# Patient Record
Sex: Female | Born: 1965 | Hispanic: No | Marital: Married | Smoking: Never smoker
Health system: Southern US, Community
[De-identification: ages and names within clinical notes are randomized; demographics above are authoritative.]

## PROBLEM LIST (undated history)

## (undated) DIAGNOSIS — Z9889 Other specified postprocedural states: Secondary | ICD-10-CM

## (undated) DIAGNOSIS — R112 Nausea with vomiting, unspecified: Secondary | ICD-10-CM

## (undated) DIAGNOSIS — K589 Irritable bowel syndrome without diarrhea: Secondary | ICD-10-CM

## (undated) HISTORY — DX: Irritable bowel syndrome, unspecified: K58.9

## (undated) HISTORY — PX: APPENDECTOMY: SHX54

---

## 1998-10-27 HISTORY — PX: CHOLECYSTECTOMY: SHX55

## 2001-04-09 ENCOUNTER — Other Ambulatory Visit: Admission: RE | Admit: 2001-04-09 | Discharge: 2001-04-09 | Payer: Self-pay | Admitting: Obstetrics and Gynecology

## 2002-01-27 ENCOUNTER — Emergency Department (HOSPITAL_COMMUNITY): Admission: EM | Admit: 2002-01-27 | Discharge: 2002-01-27 | Payer: Self-pay | Admitting: *Deleted

## 2002-02-02 ENCOUNTER — Ambulatory Visit (HOSPITAL_COMMUNITY): Admission: RE | Admit: 2002-02-02 | Discharge: 2002-02-02 | Payer: Self-pay | Admitting: *Deleted

## 2002-02-21 ENCOUNTER — Other Ambulatory Visit: Admission: RE | Admit: 2002-02-21 | Discharge: 2002-02-21 | Payer: Self-pay | Admitting: Obstetrics and Gynecology

## 2002-10-27 HISTORY — PX: COLONOSCOPY: SHX174

## 2002-11-28 ENCOUNTER — Ambulatory Visit (HOSPITAL_COMMUNITY): Admission: RE | Admit: 2002-11-28 | Discharge: 2002-11-28 | Payer: Self-pay | Admitting: Internal Medicine

## 2002-11-28 ENCOUNTER — Encounter: Payer: Self-pay | Admitting: Internal Medicine

## 2003-01-30 ENCOUNTER — Ambulatory Visit (HOSPITAL_COMMUNITY): Admission: RE | Admit: 2003-01-30 | Discharge: 2003-01-30 | Payer: Self-pay | Admitting: Internal Medicine

## 2003-03-09 ENCOUNTER — Other Ambulatory Visit: Admission: RE | Admit: 2003-03-09 | Discharge: 2003-03-09 | Payer: Self-pay | Admitting: Obstetrics and Gynecology

## 2004-04-17 ENCOUNTER — Other Ambulatory Visit: Admission: RE | Admit: 2004-04-17 | Discharge: 2004-04-17 | Payer: Self-pay | Admitting: Obstetrics and Gynecology

## 2007-10-28 HISTORY — PX: COLONOSCOPY: SHX174

## 2009-10-18 ENCOUNTER — Ambulatory Visit (HOSPITAL_COMMUNITY): Admission: RE | Admit: 2009-10-18 | Discharge: 2009-10-18 | Payer: Self-pay | Admitting: Family Medicine

## 2010-04-24 ENCOUNTER — Encounter (INDEPENDENT_AMBULATORY_CARE_PROVIDER_SITE_OTHER): Payer: Self-pay | Admitting: General Surgery

## 2010-04-24 ENCOUNTER — Observation Stay (HOSPITAL_COMMUNITY): Admission: RE | Admit: 2010-04-24 | Discharge: 2010-04-25 | Payer: Self-pay | Admitting: Family Medicine

## 2011-01-12 LAB — URINE MICROSCOPIC-ADD ON

## 2011-01-12 LAB — CBC
HCT: 36.4 % (ref 36.0–46.0)
Hemoglobin: 12.7 g/dL (ref 12.0–15.0)
MCH: 31.7 pg (ref 26.0–34.0)
MCHC: 34.8 g/dL (ref 30.0–36.0)
MCV: 91.1 fL (ref 78.0–100.0)
Platelets: 189 10*3/uL (ref 150–400)
RBC: 4 MIL/uL (ref 3.87–5.11)
RDW: 12.7 % (ref 11.5–15.5)
WBC: 5.5 10*3/uL (ref 4.0–10.5)

## 2011-01-12 LAB — COMPREHENSIVE METABOLIC PANEL
Albumin: 3.8 g/dL (ref 3.5–5.2)
Alkaline Phosphatase: 52 U/L (ref 39–117)
BUN: 8 mg/dL (ref 6–23)
Chloride: 104 mEq/L (ref 96–112)
Creatinine, Ser: 0.55 mg/dL (ref 0.4–1.2)
GFR calc non Af Amer: >60 mL/min (ref >60)
Glucose, Bld: 83 mg/dL (ref 70–99)
Potassium: 3.1 mEq/L — ABNORMAL LOW (ref 3.5–5.1)
Total Bilirubin: 0.7 mg/dL (ref 0.3–1.2)

## 2011-01-12 LAB — URINALYSIS, ROUTINE W REFLEX MICROSCOPIC
Nitrite: NEGATIVE
Specific Gravity, Urine: >1.03 — ABNORMAL HIGH (ref 1.005–1.030)
Urobilinogen, UA: 0.2 mg/dL (ref 0.0–1.0)
pH: 5.5 (ref 5.0–8.0)

## 2011-01-12 LAB — DIFFERENTIAL
Basophils Absolute: 0 10*3/uL (ref 0.0–0.1)
Basophils Relative: 0 % (ref 0–1)
Lymphocytes Relative: 13 % (ref 12–46)
Monocytes Absolute: 0.3 10*3/uL (ref 0.1–1.0)
Neutro Abs: 4.5 10*3/uL (ref 1.7–7.7)
Neutrophils Relative %: 82 % — ABNORMAL HIGH (ref 43–77)

## 2011-01-12 LAB — URINE CULTURE

## 2011-01-12 LAB — POCT PREGNANCY, URINE: Preg Test, Ur: NEGATIVE

## 2011-03-14 NOTE — Op Note (Signed)
NAME:  Samantha [name], Samantha [name]                          ACCOUNT NO.:  [id number]   MEDICAL RECORD NO.:  [id number]                   PATIENT TYPE:  AMB   LOCATION:  DAY                                  FACILITY:  [facility]   PHYSICIAN:  R. Roetta Sessions, M.D.              DATE OF BIRTH:  08-23-1966   DATE OF PROCEDURE:  01/30/2003  DATE OF DISCHARGE:                                 OPERATIVE REPORT   PROCEDURE:  Diagnostic colonoscopy.   INDICATIONS FOR PROCEDURE:  The patient is a 45 year old lady with a 20+-  year history of intermittent diarrhea, intermittent low-volume hematochezia.  Celiac antibody profile was recently normal.  She has been diagnosed with  irritable bowel syndrome previously.  Colonoscopy is now being done to  further evaluate diarrhea and hematochezia.  This approach has been  discussed with the patient at length.  The potential risks, benefits, and  alternatives have been reviewed and questions answered.  Please see my  consultation noted of 01/23/2003 for more information.   PROCEDURE:  O2 saturation, blood pressure, pulses, and respirations were  monitored throughout the entire procedure.  Conscious sedation was with  Versed 4 mg IV, Demerol 50 mg IV in divided doses.  The instrument used was  the Olympus video chip colonoscope.   FINDINGS:  Digital rectal examination revealed no abnormalities.   ENDOSCOPIC FINDINGS:  The prep was adequate.   Rectum:  Examination of the rectal mucosa including retroflex view of the  anal verge and __________  view of the anal canal demonstrated internal  hemorrhoids only.   Colon:  The colonic mucosa was surveyed from the rectosigmoid junction  through the left, transverse, right colon to the area of the appendiceal  orifice, ileocecal valve, and cecum.  These structures were well-seen and  photographed for the record. The colonic mucosa all the way to the cecum  appeared normal.  The terminal ileum was intubated to 10 cm.  This  segment  of GI tract also appeared normal.  From this level, the scope was slowly and  cautiously withdrawn.  All previously mentioned mucosal surfaces were again  seen.  No other abnormalities were observed.  The patient tolerated the  procedure well and was reactive in endoscopy.   IMPRESSION:  Internal hemorrhoids, otherwise normal rectum, colon, and  terminal ileum.  The patient most likely experienced rectal bleeding from  hemorrhoids from time to time.   RECOMMENDATIONS:  1. Given the chronicity of her diarrhea and extensive negative workup,     including today's findings, I feel that the above reaffirms the diagnosis     of irritable bowel syndrome.  Recommendations were discussed about the     benign but chronic nature of irritable bowel syndrome with the patient.     Hemorrhoid literature provided to the patient.     10-day course of Anusol HC suppositories one per rectum at bedtime.  2.  NuLev tablets one on the tongue before meals and at bedtime as needed for     abdominal cramps and diarrhea.  3. Follow up with me in one month.                                               Jonathon Bellows, M.D.    RMR/MEDQ  D:  01/30/2003  T:  01/30/2003  Job:  [id number]   cc:   Corrie Mckusick, M.D.  [address] [place]    [place] [address]  Fax: [phone]

## 2011-03-14 NOTE — H&P (Signed)
[name], [name]                          ACCOUNT NO.:  [id number]   MEDICAL RECORD NO.:  [id number]                   PATIENT TYPE:   LOCATION:                                       FACILITY:   PHYSICIAN:  R. Roetta Sessions, M.D.              DATE OF BIRTH:  1966/10/10   DATE OF ADMISSION:  DATE OF DISCHARGE:                                HISTORY & PHYSICAL   CHIEF COMPLAINT:  Diarrhea and blood per rectum, in need of colonoscopy.   HISTORY OF PRESENT ILLNESS:  The patient is a 45 year old lady with at least  a 20-year history of diarrhea and intermittent small volume blood per  rectum.  She has failed to respond to a course of Bentyl and Pepto Bismol  recently.  Stool studies have been negative.  Small bowel follow through,  aside from revealing a single descending duodenal diverticulum, was  negative.  She is very much concerned about the possibility of having  underlying carcinoma.  She has been seen by multiple physicians previously.  Once, colonoscopy was reportedly negative back in the mid 1990s.  Metabolic  profile including TSH recently negative.   PAST MEDICAL HISTORY:  Given diagnosis of irritable bowel syndrome  previously.   PAST SURGICAL HISTORY:  Cholecystectomy for cholelithiasis in [place] in [place].   CURRENT MEDICATIONS:  Tylenol p.r.n.   ALLERGIES:  CLARITIN and BENTYL (caused worsening of abdominal pain and  diarrhea per the patient's report).   FAMILY HISTORY:  No family history of IBD or colorectal cancer.  Both  parents have symptoms of TIAs.   SOCIAL HISTORY:  The patient has been married for 18 years, has one child,  is employed as a Scientist, physiological at [employer].   REVIEW OF SYSTEMS:  No chest pain, dyspnea, fever, chills.  A couple of  years ago did weight 100 pounds but because oral intake has picked up she is  up to 124.   PHYSICAL EXAMINATION TODAY:  GENERAL:  Alert, well-groomed, comfortable,  conversant, no  acute distress.  VITAL SIGNS:  Weight 128, blood pressure 100/60, pulse 68.  SKIN:  Warm and dry.  CHEST:  Lungs are clear to auscultation.  CARDIAC:  Regular rate and rhythm without murmur, gallop, or rub.  ABDOMEN:  Nondistended, positive bowel sounds, soft, nontender, without  appreciable mass or organomegaly.  EXTREMITIES:  No edema.  RECTAL:  Deferred to time of colonoscopy.   IMPRESSION:  The patient is a 45 year old lady with at least a two-decade  history of diarrhea, small volume blood per rectum occasionally.  She tells  me intermittently she has normal bowel function (i.e. one stool daily),  never more than five or six loose stools daily.   Symptoms really sound most consistent with irritable bowel syndrome.  The  patient has a fear of an underlying malignancy causing her symptoms.  We  have talked about the  benign but chronic nature of irritable bowel syndrome;  she does have low-volume blood per rectum.   RECOMMENDATIONS:  We will go ahead and offer the patient a colonoscopy to  further evaluate her chronic diarrhea and blood per rectum.  In addition,  will go ahead and draw a celiac and antibody to near completion of the  workup of her diarrhea.  Her questions were answered and she is agreeable to  the plan to perform colonoscopy in the near future.                                               Jonathon Bellows, M.D.    RMR/MEDQ  D:  01/23/2003  T:  01/23/2003  Job:  [id number]   cc:   Corrie Mckusick, M.D.  [address] [place]  [place]  [place] [address]  Fax: [phone]

## 2011-09-08 ENCOUNTER — Other Ambulatory Visit: Payer: Self-pay | Admitting: Obstetrics and Gynecology

## 2012-09-16 ENCOUNTER — Other Ambulatory Visit: Payer: Self-pay | Admitting: Obstetrics and Gynecology

## 2013-09-28 ENCOUNTER — Other Ambulatory Visit: Payer: Self-pay | Admitting: Obstetrics and Gynecology

## 2014-03-21 ENCOUNTER — Other Ambulatory Visit: Payer: Self-pay | Admitting: Urology

## 2014-03-21 ENCOUNTER — Ambulatory Visit (INDEPENDENT_AMBULATORY_CARE_PROVIDER_SITE_OTHER): Payer: 59 | Admitting: Urology

## 2014-03-21 DIAGNOSIS — R3129 Other microscopic hematuria: Secondary | ICD-10-CM

## 2014-03-21 DIAGNOSIS — N368 Other specified disorders of urethra: Secondary | ICD-10-CM

## 2014-03-21 DIAGNOSIS — N393 Stress incontinence (female) (male): Secondary | ICD-10-CM

## 2014-03-21 DIAGNOSIS — N3000 Acute cystitis without hematuria: Secondary | ICD-10-CM

## 2014-03-23 ENCOUNTER — Ambulatory Visit (HOSPITAL_COMMUNITY)
Admission: RE | Admit: 2014-03-23 | Discharge: 2014-03-23 | Disposition: A | Discharge status: Home / Self Care | Payer: 59 | Source: Ambulatory Visit | Attending: Urology | Admitting: Urology

## 2014-03-23 DIAGNOSIS — R3129 Other microscopic hematuria: Secondary | ICD-10-CM | POA: Insufficient documentation

## 2014-03-23 DIAGNOSIS — N200 Calculus of kidney: Secondary | ICD-10-CM | POA: Insufficient documentation

## 2014-03-23 DIAGNOSIS — Q619 Cystic kidney disease, unspecified: Secondary | ICD-10-CM | POA: Insufficient documentation

## 2014-03-23 MED ORDER — IOHEXOL 300 MG/ML  SOLN
125.0000 mL | Freq: Once | INTRAMUSCULAR | Status: AC | PRN
  Administered 2014-03-23: 125 mL via INTRAVENOUS

## 2014-04-18 ENCOUNTER — Ambulatory Visit (INDEPENDENT_AMBULATORY_CARE_PROVIDER_SITE_OTHER): Payer: 59 | Admitting: Urology

## 2014-04-18 DIAGNOSIS — N3000 Acute cystitis without hematuria: Secondary | ICD-10-CM

## 2014-04-18 DIAGNOSIS — N281 Cyst of kidney, acquired: Secondary | ICD-10-CM

## 2014-04-18 DIAGNOSIS — R3129 Other microscopic hematuria: Secondary | ICD-10-CM

## 2014-10-17 ENCOUNTER — Ambulatory Visit (INDEPENDENT_AMBULATORY_CARE_PROVIDER_SITE_OTHER): Payer: 59 | Admitting: Urology

## 2014-10-17 DIAGNOSIS — N281 Cyst of kidney, acquired: Secondary | ICD-10-CM

## 2015-04-12 ENCOUNTER — Other Ambulatory Visit: Payer: Self-pay | Admitting: Obstetrics and Gynecology

## 2015-04-13 LAB — CYTOLOGY - PAP

## 2015-08-24 ENCOUNTER — Telehealth: Payer: Self-pay | Admitting: Internal Medicine

## 2015-08-24 ENCOUNTER — Encounter: Payer: Self-pay | Admitting: Internal Medicine

## 2015-09-13 ENCOUNTER — Ambulatory Visit (INDEPENDENT_AMBULATORY_CARE_PROVIDER_SITE_OTHER): Payer: 59 | Admitting: Gastroenterology

## 2015-09-13 ENCOUNTER — Encounter: Payer: Self-pay | Admitting: Gastroenterology

## 2015-09-13 ENCOUNTER — Other Ambulatory Visit: Payer: Self-pay

## 2015-09-13 VITALS — BP 107/71 | HR 71 | Temp 97.3°F | Ht 65.0 in | Wt 145.4 lb

## 2015-09-13 DIAGNOSIS — R1084 Generalized abdominal pain: Secondary | ICD-10-CM | POA: Diagnosis not present

## 2015-09-13 DIAGNOSIS — Z8371 Family history of colonic polyps: Secondary | ICD-10-CM | POA: Insufficient documentation

## 2015-09-13 DIAGNOSIS — R109 Unspecified abdominal pain: Secondary | ICD-10-CM

## 2015-09-13 DIAGNOSIS — K625 Hemorrhage of anus and rectum: Secondary | ICD-10-CM

## 2015-09-13 DIAGNOSIS — K529 Noninfective gastroenteritis and colitis, unspecified: Secondary | ICD-10-CM | POA: Diagnosis not present

## 2015-09-13 DIAGNOSIS — Z83719 Family history of colon polyps, unspecified: Secondary | ICD-10-CM

## 2015-09-13 DIAGNOSIS — R197 Diarrhea, unspecified: Secondary | ICD-10-CM

## 2015-09-13 MED ORDER — DICYCLOMINE HCL 10 MG PO CAPS: 10.0000 mg | ORAL_CAPSULE | Freq: Three times a day (TID) | ORAL | Status: DC

## 2015-09-13 MED ORDER — PEG 3350-KCL-NA BICARB-NACL 420 G PO SOLR: 4000.0000 mL | Freq: Once | ORAL | Status: DC

## 2015-09-13 NOTE — Patient Instructions (Signed)
Please collect stools and return to lab. Please have your lab work done. Trial of Bentyl once before each meal and at bedtime for cramps and diarrhea. No more than 4 daily. Hold for constipation.  Stop both Bentyl and Imodium 2 days prior to your bowel prep.

## 2015-09-13 NOTE — Assessment & Plan Note (Addendum)
49 year old female with chronic diarrhea for more than 3 decades presents with worsening symptoms, some rectal bleeding. Denies alarm symptoms such as nocturnal diarrhea, weight loss. Rarely has a solid stool. Recent antibiotic use, need to exclude infectious etiology such as C. difficile. Suspect baseline IBS-D, less likely IBD. She has a family history of colon polyps at a fairly young age, last colonoscopy 6 years ago.  - Plan for colonoscopy with ileoscopy plus or minus random colon biopsies in the near future. I have discussed the risks, alternatives, benefits with regards to but not limited to the risk of reaction to medication, bleeding, infection, perforation and the patient is agreeable to proceed. Written consent to be obtained. -We will check celiac serologies, TSH, stools for infectious etiology.  -Trial of Bentyl 10 mg every before meals daily at bedtime. Hold imodium and bentyl two days before bowel prep for TCS.

## 2015-09-13 NOTE — Progress Notes (Signed)
Primary Care Physician:  Colette RibasGOLDING, JOHN CABOT, MD  Primary Gastroenterologist:  Roetta SessionsMichael Rourk, MD   Chief Complaint  Patient presents with  . Diarrhea  . Blood In Stools    HPI:  Samantha [name] is a 49 y.o. female here for consideration of colonoscopy. She has chronic diarrhea dating back to teenage years. Diagnosed with IBS previously. Seen previously by Dr. Jena Gaussourk. Colonoscopy in 2004 with ileoscopy unremarkable except for internal hemorrhoids.  In 2009 she had a colonoscopy with [facility] Sherin [facility]Weissman at [facility] which was unremarkable.   Of the year she reports that her diarrhea has worsened. Has been worse in the last couple months. Antibiotics for sinusitis and other infection in that time frame. Bowel movements parotic. Denies any solid stools. Complains of postprandial fecal urgency. No nocturnal diarrhea. Will have a burst of 5 stools daily some days. During the week she utilizes Imodium sometimes up to 3-4 times daily to control her symptoms. Continues to have to take off of work related to the diarrhea. Rare solid stool. Primary blood per rectum. Unsure if it is related to her hemorrhoids or other. No melena. No heartburn, vomiting, weight loss. Symptoms associated with abdominal cramping. Sometimes better with a BM but not always. Previously tried Levsin which helped some. Never tried Bentyl or Viberzi. Continues to have regular menstrual cycles. Diarrhea worsened with cycles. States she has a history of endometriosis (diagnosed at time of her appendectomy in 2011).   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 Bowels unremarkable. Small diverticulum off the descending duodenum.    Current Outpatient Prescriptions  Medication Sig Dispense Refill  . loperamide (IMODIUM) 2 MG capsule Take 2 mg by mouth daily.    Marland Kitchen. dicyclomine (BENTYL) 10 MG  capsule Take 1 capsule (10 mg total) by mouth 4 (four) times daily -  before meals and at bedtime. 120 capsule 3  . polyethylene glycol-electrolytes (NULYTELY/GOLYTELY) 420 G solution Take 4,000 mLs by mouth once. 4000 mL 0   No current facility-administered medications for this visit.    Allergies as of 09/13/2015  . (No Known Allergies)    Past Medical History  Diagnosis Date  . IBS (irritable bowel syndrome)     Past Surgical History  Procedure Laterality Date  . Appendectomy    . Colonoscopy  2004    Dr. Jena Gaussourk: normal ileoscolonoscopy, internal hemorrhodis  . Colonoscopy  2009    [facility]:normal.  . [place]  2000    [place]    Family History  Problem Relation Age of Onset  . Colon polyps Father     [place], around age 51    Social History   Social History  . Marital Status: Married    Spouse Name: N/A  . Number of Children: 2  . Years of Education: N/A   Occupational History  . Not on file.   Social History Main Topics  . Smoking status: Never Smoker   . Smokeless tobacco: Not on file  . Alcohol Use: No  . Drug Use: No  . Sexual Activity: Not on file   Other Topics Concern  . Not on file   Social History Narrative  . No narrative on file      ROS:  General: Negative for anorexia, weight loss, fever, chills, fatigue, weakness. Eyes: Negative for vision changes.  ENT:  Negative for hoarseness, difficulty swallowing , nasal congestion. CV: Negative for chest pain, angina, palpitations, dyspnea on exertion, peripheral edema.  Respiratory: Negative for dyspnea at rest, dyspnea on exertion, cough, sputum, wheezing.  GI: See history of present illness. GU:  Negative for dysuria, hematuria, urinary incontinence, urinary frequency, nocturnal urination.  MS: Negative for joint pain, low back pain.  Derm: Negative for rash or itching.  Neuro: Negative for weakness, abnormal sensation, seizure, frequent headaches, memory loss, confusion.   Psych: Negative for anxiety, depression, suicidal ideation, hallucinations.  Endo: Negative for unusual weight change.  Heme: Negative for bruising or bleeding. Allergy: Negative for rash or hives.    Physical Examination:  BP 107/71 mmHg  Pulse 71  Temp(Src) 97.3 F (36.3 C) (Oral)  Ht 5' 5" (1.651 m)  Wt 145 lb 6.4 oz (65.953 kg)  BMI 24.20 kg/m2  LMP 09/13/2015   General: Well-nourished, well-developed in no acute distress.  Head: Normocephalic, atraumatic.   Eyes: Conjunctiva pink, no icterus. Mouth: Oropharyngeal mucosa moist and pink , no lesions erythema or exudate. Neck: Supple without thyromegaly, masses, or lymphadenopathy.  Lungs: Clear to auscultation bilaterally.  Heart: Regular rate and rhythm, no murmurs rubs or gallops.  Abdomen: Bowel sounds are normal, nontender, nondistended, no hepatosplenomegaly or masses, no abdominal bruits or    hernia , no rebound or guarding.   Rectal: deferred Extremities: No lower extremity edema. No clubbing or deformities.  Neuro: Alert and oriented x 4 , grossly normal neurologically.  Skin: Warm and dry, no rash or jaundice.   Psych: Alert and cooperative, normal mood and affect.  Labs: Labs from August 2016 What blood cell count 7600, hemoglobin 13.5, MCV 89, platelets 300,000, total bilirubin 0.6, alkaline phosphatase 68, AST 26, ALT 33, albumin 4.6, hemoglobin A1c 5.5  Imaging Studies: No results found.    

## 2015-09-14 LAB — TISSUE TRANSGLUTAMINASE, IGA: TISSUE TRANSGLUTAMINASE AB, IGA: 1 U/mL (ref <4)

## 2015-09-14 LAB — TSH: TSH: 1.009 u[IU]/mL (ref 0.350–4.500)

## 2015-09-16 NOTE — Progress Notes (Signed)
Quick Note:  No celiac. tsh normal. Await stools. tcs as planned. ______

## 2015-09-17 NOTE — Progress Notes (Signed)
CC'D TO PCP °

## 2015-09-27 ENCOUNTER — Encounter (HOSPITAL_COMMUNITY)
Admission: RE | Disposition: A | Discharge status: Home / Self Care | Payer: Self-pay | Source: Ambulatory Visit | Attending: Internal Medicine

## 2015-09-27 ENCOUNTER — Ambulatory Visit (HOSPITAL_COMMUNITY)
Admission: RE | Admit: 2015-09-27 | Discharge: 2015-09-27 | Disposition: A | Discharge status: Home / Self Care | Payer: 59 | Source: Ambulatory Visit | Attending: Internal Medicine | Admitting: Internal Medicine

## 2015-09-27 ENCOUNTER — Encounter (HOSPITAL_COMMUNITY): Payer: Self-pay | Admitting: *Deleted

## 2015-09-27 DIAGNOSIS — K921 Melena: Secondary | ICD-10-CM | POA: Insufficient documentation

## 2015-09-27 DIAGNOSIS — K648 Other hemorrhoids: Secondary | ICD-10-CM | POA: Diagnosis not present

## 2015-09-27 DIAGNOSIS — K625 Hemorrhage of anus and rectum: Secondary | ICD-10-CM

## 2015-09-27 DIAGNOSIS — Z8371 Family history of colonic polyps: Secondary | ICD-10-CM | POA: Diagnosis not present

## 2015-09-27 DIAGNOSIS — Z79899 Other long term (current) drug therapy: Secondary | ICD-10-CM | POA: Diagnosis not present

## 2015-09-27 DIAGNOSIS — R197 Diarrhea, unspecified: Secondary | ICD-10-CM | POA: Insufficient documentation

## 2015-09-27 DIAGNOSIS — R109 Unspecified abdominal pain: Secondary | ICD-10-CM | POA: Diagnosis not present

## 2015-09-27 HISTORY — PX: COLONOSCOPY: SHX5424

## 2015-09-27 SURGERY — COLONOSCOPY
Anesthesia: Moderate Sedation

## 2015-09-27 MED ORDER — SODIUM CHLORIDE 0.9 % IV SOLN
INTRAVENOUS | Status: DC
  Administered 2015-09-27: 1000 mL via INTRAVENOUS

## 2015-09-27 MED ORDER — STERILE WATER FOR IRRIGATION IR SOLN
Status: DC | PRN
  Administered 2015-09-27: 11:00:00

## 2015-09-27 MED ORDER — ONDANSETRON HCL 4 MG/2ML IJ SOLN
INTRAMUSCULAR | Status: AC
  Filled 2015-09-27: qty 2

## 2015-09-27 MED ORDER — MIDAZOLAM HCL 5 MG/5ML IJ SOLN
INTRAMUSCULAR | Status: DC | PRN
  Administered 2015-09-27 (×2): 1 mg via INTRAVENOUS
  Administered 2015-09-27: 2 mg via INTRAVENOUS

## 2015-09-27 MED ORDER — ONDANSETRON HCL 4 MG/2ML IJ SOLN
INTRAMUSCULAR | Status: DC | PRN
  Administered 2015-09-27: 4 mg via INTRAVENOUS

## 2015-09-27 MED ORDER — MEPERIDINE HCL 100 MG/ML IJ SOLN
INTRAMUSCULAR | Status: DC | PRN
  Administered 2015-09-27: 50 mg via INTRAVENOUS

## 2015-09-27 MED ORDER — MIDAZOLAM HCL 5 MG/5ML IJ SOLN
INTRAMUSCULAR | Status: AC
  Filled 2015-09-27: qty 10

## 2015-09-27 MED ORDER — MEPERIDINE HCL 100 MG/ML IJ SOLN
INTRAMUSCULAR | Status: AC
  Filled 2015-09-27: qty 2

## 2015-09-27 NOTE — H&P (View-Only) (Signed)
Primary Care Physician:  Colette RibasGOLDING, JOHN CABOT, MD  Primary Gastroenterologist:  Roetta SessionsMichael Rourk, MD   Chief Complaint  Patient presents with  . Diarrhea  . Blood In Stools    HPI:  Samantha [name] is a 49 y.o. female here for consideration of colonoscopy. She has chronic diarrhea dating back to teenage years. Diagnosed with IBS previously. Seen previously by Dr. Jena Gaussourk. Colonoscopy in 2004 with ileoscopy unremarkable except for internal hemorrhoids.  In 2009 she had a colonoscopy with Dr. Sherin QuarryWeissman at [facility] which was unremarkable.   Of the year she reports that her diarrhea has worsened. Has been worse in the last couple months. Antibiotics for sinusitis and other infection in that time frame. Bowel movements parotic. Denies any solid stools. Complains of postprandial fecal urgency. No nocturnal diarrhea. Will have a burst of 5 stools daily some days. During the week she utilizes Imodium sometimes up to 3-4 times daily to control her symptoms. Continues to have to take off of work related to the diarrhea. Rare solid stool. Primary blood per rectum. Unsure if it is related to her hemorrhoids or other. No melena. No heartburn, vomiting, weight loss. Symptoms associated with abdominal cramping. Sometimes better with a BM but not always. Previously tried Levsin which helped some. Never tried Bentyl or Viberzi. Continues to have regular menstrual cycles. Diarrhea worsened with cycles. States she has a history of endometriosis (diagnosed at time of her appendectomy in 2011).   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 Bowels unremarkable. Small diverticulum off the descending duodenum.    Current Outpatient Prescriptions  Medication Sig Dispense Refill  . loperamide (IMODIUM) 2 MG capsule Take 2 mg by mouth daily.    Marland Kitchen. dicyclomine (BENTYL) 10 MG  capsule Take 1 capsule (10 mg total) by mouth 4 (four) times daily -  before meals and at bedtime. 120 capsule 3  . polyethylene glycol-electrolytes (NULYTELY/GOLYTELY) 420 G solution Take 4,000 mLs by mouth once. 4000 mL 0   No current facility-administered medications for this visit.    Allergies as of 09/13/2015  . (No Known Allergies)    Past Medical History  Diagnosis Date  . IBS (irritable bowel syndrome)     Past Surgical History  Procedure Laterality Date  . Appendectomy    . Colonoscopy  2004    Dr. Jena Gaussourk: normal ileoscolonoscopy, internal hemorrhodis  . Colonoscopy  2009    [facility]:GN[facility]OZGI:normal.  . [place]ectomy  2000    [place]    Family History  Problem Relation Age of Onset  . Colon polyps Father     [place], around age 51    Social History   Social History  . Marital Status: Married    Spouse Name: N/A  . Number of Children: 2  . Years of Education: N/A   Occupational History  . Not on file.   Social History Main Topics  . Smoking status: Never Smoker   . Smokeless tobacco: Not on file  . Alcohol Use: No  . Drug Use: No  . Sexual Activity: Not on file   Other Topics Concern  . Not on file   Social History Narrative  . No narrative on file      ROS:  General: Negative for anorexia, weight loss, fever, chills, fatigue, weakness. Eyes: Negative for vision changes.  ENT:  Negative for hoarseness, difficulty swallowing , nasal congestion. CV: Negative for chest pain, angina, palpitations, dyspnea on exertion, peripheral edema.  Respiratory: Negative for dyspnea at rest, dyspnea on exertion, cough, sputum, wheezing.  GI: See history of present illness. GU:  Negative for dysuria, hematuria, urinary incontinence, urinary frequency, nocturnal urination.  MS: Negative for joint pain, low back pain.  Derm: Negative for rash or itching.  Neuro: Negative for weakness, abnormal sensation, seizure, frequent headaches, memory loss, confusion.   Psych: Negative for anxiety, depression, suicidal ideation, hallucinations.  Endo: Negative for unusual weight change.  Heme: Negative for bruising or bleeding. Allergy: Negative for rash or hives.    Physical Examination:  BP 107/71 mmHg  Pulse 71  Temp(Src) 97.3 F (36.3 C) (Oral)  Ht 5\' 5"  (1.651 m)  Wt 145 lb 6.4 oz (65.953 kg)  BMI 24.20 kg/m2  LMP 09/13/2015   General: Well-nourished, well-developed in no acute distress.  Head: Normocephalic, atraumatic.   Eyes: Conjunctiva pink, no icterus. Mouth: Oropharyngeal mucosa moist and pink , no lesions erythema or exudate. Neck: Supple without thyromegaly, masses, or lymphadenopathy.  Lungs: Clear to auscultation bilaterally.  Heart: Regular rate and rhythm, no murmurs rubs or gallops.  Abdomen: Bowel sounds are normal, nontender, nondistended, no hepatosplenomegaly or masses, no abdominal bruits or    hernia , no rebound or guarding.   Rectal: deferred Extremities: No lower extremity edema. No clubbing or deformities.  Neuro: Alert and oriented x 4 , grossly normal neurologically.  Skin: Warm and dry, no rash or jaundice.   Psych: Alert and cooperative, normal mood and affect.  Labs: Labs from August 2016 What blood cell count 7600, hemoglobin 13.5, MCV 89, platelets 300,000, total bilirubin 0.6, alkaline phosphatase 68, AST 26, ALT 33, albumin 4.6, hemoglobin A1c 5.5  Imaging Studies: No results found.

## 2015-09-27 NOTE — Op Note (Signed)
[facility] [address] [place], [id number]   COLONOSCOPY PROCEDURE REPORT  PATIENT: Samantha [name], Kristin E  MR#: [id number] BIRTHDATE: 10-May-1966 , 49  yrs. old GENDER: female ENDOSCOPIST: R.  Roetta SessionsMichael Leasha Goldberger, MD FACP [facility] REFERRED LK:GMWNUUBY:Kellie Goldsborough, M.D. PROCEDURE DATE:  09/27/2015 PROCEDURE:    Ileo-Colonoscopy, diagnostic and with biopsy INDICATIONS:Chronic diarrhea. MEDICATIONS: Versed 4 mg IV and Demerol 50 mg IV in divided doses. Zofran 4 mg IV. ASA CLASS:       Class II  CONSENT: The risks, benefits, alternatives and imponderables including but not limited to bleeding, perforation as well as the possibility of a missed lesion have been reviewed.  The potential for biopsy, lesion removal, etc. have also been discussed. Questions have been answered.  All parties agreeable.  Please see the history and physical in the medical record for more information.  DESCRIPTION OF PROCEDURE:   After the risks benefits and alternatives of the procedure were thoroughly explained, informed consent was obtained.  The digital rectal exam revealed no abnormalities of the rectum.   The EC-[id number] ([id number]  endoscope was introduced through the anus and advanced to the terminal ileum which was intubated for a short distance. No adverse events experienced.   The quality of the prep was adequate  The instrument was then slowly withdrawn as the colon was fully examined. Estimated blood loss is zero unless otherwise noted in this procedure report.      COLON FINDINGS: Normal-appearing rectal mucosa.  Rectal vault small; unable to retroflex.  However, mucosal surfaces seen well on?"face. Minimal internal hemorrhoids.  Normal-appearing colonic mucosa. The distal 10 cm of terminal ileal mucosa also appeared normal.  A segmental biopsies of the ascending/descending/sigmoid segments taken to evaluate for microscopic colitis.  Also, a stool sample was submitted for GI pathogen  panel.  Retroflexion was not performed. .  Withdrawal time=7 minutes 0 seconds.  The scope was withdrawn and the procedure completed. COMPLICATIONS: There were no immediate complications. EBL 2 mL ENDOSCOPIC IMPRESSION: Normal Ileo- colonoscopy  -  status post biopsy and stool sampling  RECOMMENDATIONS: Follow-up on pending studies. Further recommendations to follow.  eSigned:  R. Roetta SessionsMichael Melvenia Favela, MD Jerrel IvoryFACP [facility] 09/27/2015 11:03 AM   cc:  CPT CODES: ICD CODES:  The ICD and CPT codes recommended by this software are interpretations from the data that the clinical staff has captured with the software.  The verification of the translation of this report to the ICD and CPT codes and modifiers is the sole responsibility of the health care institution and practicing physician where this report was generated.  [employer] will not be held responsible for the validity of the ICD and CPT codes included on this report.  AMA assumes no liability for data contained or not contained herein. CPT is a Publishing rights managerregistered trademark of the [employer].  PATIENT NAME:  Samantha [name], Nur E MR#: [id number]

## 2015-09-27 NOTE — Interval H&P Note (Signed)
History and Physical Interval Note:  09/27/2015 10:32 AM  Samantha [name]  has presented today for surgery, with the diagnosis of rectal bleeding, diarrhea, abdominal pain, family history of colon polyps  The various methods of treatment have been discussed with the patient and family. After consideration of risks, benefits and other options for treatment, the patient has consented to  Procedure(s) with comments: COLONOSCOPY (N/A) - 1015 - moved to 10:30 - office to notify as a surgical intervention .  The patient's history has been reviewed, patient examined, no change in status, stable for surgery.  I have reviewed the patient's chart and labs.  Questions were answered to the patient's satisfaction.     Samantha [name]  No change. Colonoscopy per plan.  The risks, benefits, limitations, alternatives and imponderables have been reviewed with the patient. Questions have been answered. All parties are agreeable.

## 2015-09-27 NOTE — Discharge Instructions (Signed)
  Colonoscopy Discharge Instructions  Read the instructions outlined below and refer to this sheet in the next few weeks. These discharge instructions provide you with general information on caring for yourself after you leave the hospital. Your doctor may also give you specific instructions. While your treatment has been planned according to the most current medical practices available, unavoidable complications occasionally occur. If you have any problems or questions after discharge, call Dr. Jahmel Flannagan at [phone]. ACTIVITY  You may resume your regular activity, but move at a slower pace for the next 24 hours.   Take frequent rest periods for the next 24 hours.   Walking will help get rid of the air and reduce the bloated feeling in your belly (abdomen).   No driving for 24 hours (because of the medicine (anesthesia) used during the test).    Do not sign any important legal documents or operate any machinery for 24 hours (because of the anesthesia used during the test).  NUTRITION  Drink plenty of fluids.   You may resume your normal diet as instructed by your doctor.   Begin with a light meal and progress to your normal diet. Heavy or fried foods are harder to digest and may make you feel sick to your stomach (nauseated).   Avoid alcoholic beverages for 24 hours or as instructed.  MEDICATIONS  You may resume your normal medications unless your doctor tells you otherwise.  WHAT YOU CAN EXPECT TODAY  Some feelings of bloating in the abdomen.   Passage of more gas than usual.   Spotting of blood in your stool or on the toilet paper.  IF YOU HAD POLYPS REMOVED DURING THE COLONOSCOPY:  No aspirin products for 7 days or as instructed.   No alcohol for 7 days or as instructed.   Eat a soft diet for the next 24 hours.  FINDING OUT THE RESULTS OF YOUR TEST Not all test results are available during your visit. If your test results are not back during the visit, make an appointment  with your caregiver to find out the results. Do not assume everything is normal if you have not heard from your caregiver or the medical facility. It is important for you to follow up on all of your test results.  SEEK IMMEDIATE MEDICAL ATTENTION IF:  You have more than a spotting of blood in your stool.   Your belly is swollen (abdominal distention).   You are nauseated or vomiting.   You have a temperature over 101.   You have abdominal pain or discomfort that is severe or gets worse throughout the day.   Further recommendations to follow pending review of pathology report 

## 2015-09-29 LAB — GI PATHOGEN PANEL BY PCR, STOOL
C DIFFICILE TOXIN A/B: NOT DETECTED
CAMPYLOBACTER BY PCR: NOT DETECTED
Cryptosporidium by PCR: NOT DETECTED
E coli (ETEC) LT/ST: NOT DETECTED
E coli (STEC): NOT DETECTED
E coli 0157 by PCR: NOT DETECTED
G LAMBLIA BY PCR: NOT DETECTED
Norovirus GI/GII: NOT DETECTED
Rotavirus A by PCR: NOT DETECTED
Salmonella by PCR: NOT DETECTED
Shigella by PCR: NOT DETECTED

## 2015-09-30 ENCOUNTER — Encounter: Payer: Self-pay | Admitting: Internal Medicine

## 2015-10-01 ENCOUNTER — Encounter: Payer: Self-pay | Admitting: Internal Medicine

## 2015-10-01 ENCOUNTER — Encounter (HOSPITAL_COMMUNITY): Payer: Self-pay | Admitting: Internal Medicine

## 2015-10-01 ENCOUNTER — Other Ambulatory Visit: Payer: Self-pay | Admitting: Urology

## 2015-10-01 ENCOUNTER — Other Ambulatory Visit: Payer: Self-pay

## 2015-10-01 ENCOUNTER — Telehealth: Payer: Self-pay

## 2015-10-01 DIAGNOSIS — K529 Noninfective gastroenteritis and colitis, unspecified: Secondary | ICD-10-CM

## 2015-10-01 DIAGNOSIS — N281 Cyst of kidney, acquired: Secondary | ICD-10-CM

## 2015-10-01 NOTE — Telephone Encounter (Signed)
Lab order done and mailed to the pt.  

## 2015-10-01 NOTE — Telephone Encounter (Signed)
-----   Message from Corbin Adeobert M Rourk, MD sent at 09/27/2015 10:33 AM EST ----- Was a total serum IgA ordered on this patient. I see the TTG results. If total serum IgA level not ordered, it needs to be done. Thanks.

## 2015-10-02 ENCOUNTER — Ambulatory Visit (HOSPITAL_COMMUNITY)
Admission: RE | Admit: 2015-10-02 | Discharge: 2015-10-02 | Disposition: A | Discharge status: Home / Self Care | Payer: 59 | Source: Ambulatory Visit | Attending: Urology | Admitting: Urology

## 2015-10-02 DIAGNOSIS — N281 Cyst of kidney, acquired: Secondary | ICD-10-CM | POA: Diagnosis not present

## 2015-10-05 ENCOUNTER — Other Ambulatory Visit (HOSPITAL_COMMUNITY): Payer: 59

## 2015-10-16 ENCOUNTER — Ambulatory Visit (INDEPENDENT_AMBULATORY_CARE_PROVIDER_SITE_OTHER): Payer: 59 | Admitting: Urology

## 2015-10-16 ENCOUNTER — Other Ambulatory Visit: Payer: Self-pay | Admitting: Urology

## 2015-10-16 DIAGNOSIS — N281 Cyst of kidney, acquired: Secondary | ICD-10-CM | POA: Diagnosis not present

## 2015-10-17 ENCOUNTER — Ambulatory Visit: Payer: 59 | Admitting: Gastroenterology

## 2015-11-12 ENCOUNTER — Encounter: Payer: Self-pay | Admitting: Gastroenterology

## 2015-11-12 ENCOUNTER — Ambulatory Visit (INDEPENDENT_AMBULATORY_CARE_PROVIDER_SITE_OTHER): Payer: 59 | Admitting: Gastroenterology

## 2015-11-12 VITALS — BP 99/64 | HR 66 | Temp 97.6°F | Ht 60.0 in | Wt 143.0 lb

## 2015-11-12 DIAGNOSIS — K529 Noninfective gastroenteritis and colitis, unspecified: Secondary | ICD-10-CM

## 2015-11-12 NOTE — Patient Instructions (Signed)
1. Take Bentyl one every morning. You may take an additional dose later in the day if needed for pain or loose stools. 2. Try to use Imodium as a "rescue" medication, limit dose to avoid constipation. 3. Call with progress report in two weeks.  4. Please go to have lab work done. We will call you with results in 5-7 business days.

## 2015-11-12 NOTE — Progress Notes (Signed)
      Primary Care Physician: Colette RibasGOLDING, JOHN CABOT, MD  Primary Gastroenterologist:  Roetta SessionsMichael Rourk, MD   Chief Complaint  Patient presents with  . Follow-up    HPI: Samantha PizzaCelia E [name] is a 50 y.o. female here for follow up. She has chronic diarrhea dating back to teenage years. Diagnosed with IBS previously. Ileocolonoscopy back in December for acute on chronic diarrhea was unremarkable. Random colon biopsies negative. GI pathogen panel was negative. TTG and TSH unremarkable.  Bentyl prn LLQ pain. Helps with pain.  At first taking 2-3 times per day but tends to make next day worse. Solid stools only when taking imodium. Works at call center. Can not get up to go to bathroom. Avoids dairy and greasy foods because it makes diarrhea worse. Diarrhea worse with menstrual cycles. LMP 08/2015. No heartburn, vomiting. Appetite good.   Current Outpatient Prescriptions  Medication Sig Dispense Refill  . dicyclomine (BENTYL) 10 MG capsule Take 1 capsule (10 mg total) by mouth 4 (four) times daily -  before meals and at bedtime. 120 capsule 3  . loperamide (IMODIUM) 2 MG capsule Take 2 mg by mouth daily.     No current facility-administered medications for this visit.    Allergies as of 11/12/2015  . (No Known Allergies)    ROS:  General: Negative for anorexia, weight loss, fever, chills, fatigue, weakness. ENT: Negative for hoarseness, difficulty swallowing , nasal congestion. CV: Negative for chest pain, angina, palpitations, dyspnea on exertion, peripheral edema.  Respiratory: Negative for dyspnea at rest, dyspnea on exertion, cough, sputum, wheezing.  GI: See history of present illness. GU:  Negative for dysuria, hematuria, urinary incontinence, urinary frequency, nocturnal urination.  Endo: Negative for unusual weight change.    Physical Examination:   BP 99/64 mmHg  Pulse 66  Temp(Src) 97.6 F (36.4 C)  Ht 5' (1.524 m)  Wt 143 lb (64.864 kg)  BMI 27.93 kg/m2  LMP 09/12/2015  (Within Days)  General: Well-nourished, well-developed in no acute distress.  Eyes: No icterus. Mouth: Oropharyngeal mucosa moist and pink , no lesions erythema or exudate. Lungs: Clear to auscultation bilaterally.  Heart: Regular rate and rhythm, no murmurs rubs or gallops.  Abdomen: Bowel sounds are normal, nontender, nondistended, no hepatosplenomegaly or masses, no abdominal bruits or hernia , no rebound or guarding.   Extremities: No lower extremity edema. No clubbing or deformities. Neuro: Alert and oriented x 4   Skin: Warm and dry, no jaundice.   Psych: Alert and cooperative, normal mood and affect.

## 2015-11-12 NOTE — Progress Notes (Signed)
cc'ed to pcp °

## 2015-11-12 NOTE — Assessment & Plan Note (Signed)
Suspect IBS-D. Take Bentyl 10mg  every morning. Limit to 2 daily to avoid constipation. Use Imodium as rescue maneuver only when has to.   IgA level.   Call with PR in two weeks. 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 If future consideration of Viberzi or radiological testing, would recommend pregnancy test.

## 2015-12-03 LAB — IGA: IgA: 363 mg/dL (ref 69–380)

## 2015-12-06 NOTE — Progress Notes (Signed)
Quick Note:  Let patient know she does not have celiac disease. TTG previously normal and RMR requested IgA level to make sure no IgA deficiency that would lead to false negative TTG.  Continue plan as outlined at time of ov. Call if persistent symptoms and medication can be adjusted/changed. ______

## 2016-09-30 ENCOUNTER — Ambulatory Visit (HOSPITAL_COMMUNITY): Admission: RE | Admit: 2016-09-30 | Payer: 59 | Source: Ambulatory Visit

## 2016-10-07 ENCOUNTER — Ambulatory Visit: Payer: 59 | Admitting: Urology

## 2018-09-02 DIAGNOSIS — Z1389 Encounter for screening for other disorder: Secondary | ICD-10-CM | POA: Diagnosis not present

## 2018-09-02 DIAGNOSIS — R635 Abnormal weight gain: Secondary | ICD-10-CM | POA: Diagnosis not present

## 2018-09-02 DIAGNOSIS — Z6833 Body mass index (BMI) 33.0-33.9, adult: Secondary | ICD-10-CM | POA: Diagnosis not present

## 2018-09-02 DIAGNOSIS — E669 Obesity, unspecified: Secondary | ICD-10-CM | POA: Diagnosis not present

## 2019-06-07 ENCOUNTER — Other Ambulatory Visit: Payer: Self-pay

## 2019-06-07 DIAGNOSIS — Z20822 Contact with and (suspected) exposure to covid-19: Secondary | ICD-10-CM

## 2019-06-08 LAB — NOVEL CORONAVIRUS, NAA: SARS-CoV-2, NAA: NOT DETECTED

## 2019-06-21 ENCOUNTER — Other Ambulatory Visit: Payer: Self-pay

## 2019-06-21 DIAGNOSIS — Z20822 Contact with and (suspected) exposure to covid-19: Secondary | ICD-10-CM

## 2019-06-23 LAB — NOVEL CORONAVIRUS, NAA: SARS-CoV-2, NAA: NOT DETECTED

## 2020-09-25 ENCOUNTER — Encounter: Payer: Self-pay | Admitting: Internal Medicine

## 2020-11-21 ENCOUNTER — Other Ambulatory Visit: Payer: Self-pay

## 2020-11-21 ENCOUNTER — Encounter: Payer: Self-pay | Admitting: Urology

## 2020-11-21 ENCOUNTER — Ambulatory Visit (INDEPENDENT_AMBULATORY_CARE_PROVIDER_SITE_OTHER): Payer: 59 | Admitting: Urology

## 2020-11-21 VITALS — BP 109/76 | HR 67 | Temp 98.4°F | Ht 60.0 in | Wt 169.0 lb

## 2020-11-21 DIAGNOSIS — R32 Unspecified urinary incontinence: Secondary | ICD-10-CM | POA: Diagnosis not present

## 2020-11-21 LAB — URINALYSIS, ROUTINE W REFLEX MICROSCOPIC
Bilirubin, UA: NEGATIVE
Glucose, UA: NEGATIVE
Ketones, UA: NEGATIVE
Nitrite, UA: NEGATIVE
Protein,UA: NEGATIVE
RBC, UA: NEGATIVE
Specific Gravity, UA: 1.015 (ref 1.005–1.030)
Urobilinogen, Ur: 0.2 mg/dL (ref 0.2–1.0)
pH, UA: 5 (ref 5.0–7.5)

## 2020-11-21 LAB — MICROSCOPIC EXAMINATION
Epithelial Cells (non renal): >10 /hpf — AB (ref 0–10)
RBC: NONE SEEN /hpf (ref 0–2)
Renal Epithel, UA: NONE SEEN /hpf

## 2020-11-21 MED ORDER — MIRABEGRON ER 25 MG PO TB24: 25.0000 mg | ORAL_TABLET | Freq: Every day | ORAL | 0 refills | Status: DC

## 2020-11-21 NOTE — Progress Notes (Signed)
11/21/2020 10:40 AM   Samantha [name] [birth year] [id number]  Referring provider: Assunta Found, MD [address] [place],  [place] [address]  Urinary incontinence  HPI: Samantha [name] is a 54yo here for evaluation of urinary incontinence. For the past year she has noted increased urinary incontinence. She has SUi and UUI. 6 pads per day. She previously saw Samantha [name] at [facility] who recommended pelvic floor PT and weight loss. She continues to work on losing weight. She notes her SUI is mild and she is more bothered by the urge incontinence. No numbness in her fingers/toes.    PMH: Past Medical History:  Diagnosis Date  . IBS (irritable bowel syndrome)     Surgical History: Past Surgical History:  Procedure Laterality Date  . APPENDECTOMY    . CHOLE[place]STECTOMY  2000   [place]  . COLONOSCOPY  2004   Samantha [name]: normal ileoscolonoscopy, internal hemorrhodis  . COLONOSCOPY  2009   [facility] IW:[id number].  . COLONOSCOPY N/A 09/27/2015   RMR: Normal ileo-colonoscopy staus post biopsy and stool sampling    Home Medications:  Allergies as of 11/21/2020   No Known Allergies     Medication List       Accurate as of November 21, 2020 10:40 AM. If you have any questions, ask your nurse or doctor.        dicyclomine 10 MG capsule Commonly known as: BENTYL Take 1 capsule (10 mg total) by mouth 4 (four) times daily -  before meals and at bedtime.   loperamide 2 MG capsule Commonly known as: IMODIUM Take 2 mg by mouth daily.       Allergies: No Known Allergies  Family History: Family History  Problem Relation Age of Onset  . Colon polyps Father        [place], around age 34    Social History:  reports that she has never smoked. She has never used smokeless tobacco. She reports that she does not drink alcohol and does not use drugs.  ROS: All other review of systems were reviewed and are negative except what is noted above in HPI  Physical Exam: BP 109/76    Pulse 67   Temp 98.4 F (36.9 C)   Ht 5' (1.524 m)   Wt 169 lb (76.7 kg)   BMI 33.01 kg/m   Constitutional:  Alert and oriented, No acute distress. HEENT: [place] AT, moist mucus membranes.  Trachea midline, no masses. Cardiovascular: No clubbing, cyanosis, or edema. Respiratory: Normal respiratory effort, no increased work of breathing. GI: Abdomen is soft, nontender, nondistended, no abdominal masses GU: No CVA tenderness.  Lymph: No cervical or inguinal lymphadenopathy. Skin: No rashes, bruises or suspicious lesions. Neurologic: Grossly intact, no focal deficits, moving all 4 extremities. Psychiatric: Normal mood and affect.  Laboratory Data: Lab Results  Component Value Date   WBC 5.5 04/24/2010   HGB 12.7 04/24/2010   HCT 36.4 04/24/2010   MCV 91.1 04/24/2010   PLT 189 04/24/2010    Lab Results  Component Value Date   CREATININE 0.55 04/24/2010    No results [name] for: PSA  No results [name] for: TESTOSTERONE  No results [name] for: HGBA1C  Urinalysis    Component Value Date/Time   COLORURINE YELLOW 04/24/2010 1505   APPEARANCEUR CLEAR 04/24/2010 1505   LABSPEC >1.030 (H) 04/24/2010 1505   PHURINE 5.5 04/24/2010 1505   GLUCOSEU NEGATIVE 04/24/2010 1505   HGBUR LARGE (A) 04/24/2010 1505   BILIRUBINUR SMALL (A) 04/24/2010 1505  KETONESUR >80 (A) 04/24/2010 1505   PROTEINUR TRACE (A) 04/24/2010 1505   UROBILINOGEN 0.2 04/24/2010 1505   NITRITE NEGATIVE 04/24/2010 1505   LEUKOCYTESUR SMALL (A) 04/24/2010 1505    Lab Results  Component Value Date   BACTERIA MANY (A) 04/24/2010    Pertinent Imaging:  No results [name] for this or any previous visit.  No results [name] for this or any previous visit.  No results [name] for this or any previous visit.  No results [name] for this or any previous visit.  Results for orders placed during the hospital encounter of 10/02/15  US Renal  Narrative CLINICAL DATA:  Renal cysts.  History of UTIs.  EXAM: RENAL  / URINARY TRACT ULTRASOUND COMPLETE  COMPARISON:  CT 03/23/2014, 04/24/2010.  FINDINGS: Right Kidney:  Length: 12.4 cm. Echogenicity within normal limits. No mass or hydronephrosis visualized.  Left Kidney:  Length: 12.8 cm. Echogenicity within normal limits. No hydronephrosis visualized. Two large left renal cysts are noted, 1 in the upper pole measuring 6.5 cm, the other in the mid to lower pole measuring 8.4 cm. No significant change noted from prior CT .  Bladder:  Appears normal for degree of bladder distention.  IMPRESSION: 1. No acute abnormality. 2. Prominent left renal cysts again noted. Similar findings noted on prior CT.   Electronically Signed By: Samantha Fus  [name] On: 10/02/2015 11:38  No results [name] for this or any previous visit.  No results [name] for this or any previous visit.  No results [name] for this or any previous visit.   Assessment & Plan:    1. Urinary incontinence, unspecified type -We will try mirabegron 25mg  daily - BLADDER SCAN AMB NON-IMAGING - Urinalysis, Routine w reflex microscopic   No follow-ups on file.  , MD  [facility] [place]

## 2020-11-21 NOTE — Progress Notes (Signed)
Bladder Scan Patient cannot void: 49 ml Performed By: Antawn Sison,lpn    Urological Symptom Review  Patient is experiencing the following symptoms: Frequent urination Hard to postpone urination Leakage of urine Urinary tract infection   Review of Systems  Gastrointestinal (upper)  : Negative for upper GI symptoms  Gastrointestinal (lower) : Diarrhea  Constitutional : Negative for symptoms  Skin: Negative for skin symptoms  Eyes: Negative for eye symptoms  Ear/Nose/Throat : Sinus problems  Hematologic/Lymphatic: Negative for Hematologic/Lymphatic symptoms  Cardiovascular : Negative for cardiovascular symptoms  Respiratory : Negative for respiratory symptoms  Endocrine: Negative for endocrine symptoms  Musculoskeletal: Negative for musculoskeletal symptoms  Neurological: Dizziness  Psychologic: Negative for psychiatric symptoms

## 2020-12-03 NOTE — Patient Instructions (Signed)
Overactive Bladder, Adult  Overactive bladder is a condition in which a person has a sudden and frequent need to urinate. A person might also leak urine if he or she cannot get to the bathroom fast enough (urinary incontinence). Sometimes, symptoms can interfere with work or social activities. What are the causes? Overactive bladder is associated with poor nerve signals between your bladder and your brain. Your bladder may get the signal to empty before it is full. You may also have very sensitive muscles that make your bladder squeeze too soon. This condition may also be caused by other factors, such as:  Medical conditions: ? Urinary tract infection. ? Infection of nearby tissues. ? Prostate enlargement. ? Bladder stones, inflammation, or tumors. ? Diabetes. ? Muscle or nerve weakness, especially from these conditions:  A spinal cord injury.  Stroke.  Multiple sclerosis.  Parkinson's disease.  Other causes: ? Surgery on the uterus or urethra. ? Drinking too much caffeine or alcohol. ? Certain medicines, especially those that eliminate extra fluid in the body (diuretics). ? Constipation. What increases the risk? You may be at greater risk for overactive bladder if you:  Are an older adult.  Smoke.  Are going through menopause.  Have prostate problems.  Have a neurological disease, such as stroke, dementia, Parkinson's disease, or multiple sclerosis (MS).  Eat or drink alcohol, spicy food, caffeine, and other things that irritate the bladder.  Are overweight or obese. What are the signs or symptoms? Symptoms of this condition include a sudden, strong urge to urinate. Other symptoms include:  Leaking urine.  Urinating 8 or more times a day.  Waking up to urinate 2 or more times overnight. How is this diagnosed? This condition may be diagnosed based on:  Your symptoms and medical history.  A physical exam.  Blood or urine tests to check for possible causes,  such as infection. You may also need to see a health care provider who specializes in urinary tract problems. This is called a urologist. How is this treated? Treatment for overactive bladder depends on the cause of your condition and whether it is mild or severe. Treatment may include:  Bladder training, such as: ? Learning to control the urge to urinate by following a schedule to urinate at regular intervals. ? Doing Kegel exercises to strengthen the pelvic floor muscles that support your bladder.  Special devices, such as: ? Biofeedback. This uses sensors to help you become aware of your body's signals. ? Electrical stimulation. This uses electrodes placed inside the body (implanted) or outside the body. These electrodes send gentle pulses of electricity to strengthen the nerves or muscles that control the bladder. ? Women may use a plastic device, called a pessary, that fits into the vagina and supports the bladder.  Medicines, such as: ? Antibiotics to treat bladder infection. ? Antispasmodics to stop the bladder from releasing urine at the wrong time. ? Tricyclic antidepressants to relax bladder muscles. ? Injections of botulinum toxin type A directly into the bladder tissue to relax bladder muscles.  Surgery, such as: ? A device may be implanted to help manage the nerve signals that control urination. ? An electrode may be implanted to stimulate electrical signals in the bladder. ? A procedure may be done to change the shape of the bladder. This is done only in very severe cases. Follow these instructions at home: Eating and drinking  Make diet or lifestyle changes recommended by your health care provider. These may include: ? Drinking fluids   throughout the day and not only with meals. ? Cutting down on caffeine or alcohol. ? Eating a healthy and balanced diet to prevent constipation. This may include:  Choosing foods that are high in fiber, such as beans, whole grains, and  fresh fruits and vegetables.  Limiting foods that are high in fat and processed sugars, such as fried and sweet foods.   Lifestyle  Lose weight if needed.  Do not use any products that contain nicotine or tobacco. These include cigarettes, chewing tobacco, and vaping devices, such as e-cigarettes. If you need help quitting, ask your health care provider.   General instructions  Take over-the-counter and prescription medicines only as told by your health care provider.  If you were prescribed an antibiotic medicine, take it as told by your health care provider. Do not stop taking the antibiotic even if you start to feel better.  Use any implants or pessary as told by your health care provider.  If needed, wear pads to absorb urine leakage.  Keep a log to track how much and when you drink, and when you need to urinate. This will help your health care provider monitor your condition.  Keep all follow-up visits. This is important. Contact a health care provider if:  You have a fever or chills.  Your symptoms do not get better with treatment.  Your pain and discomfort get worse.  You have more frequent urges to urinate. Get help right away if:  You are not able to control your bladder. Summary  Overactive bladder refers to a condition in which a person has a sudden and frequent need to urinate.  Several conditions may lead to an overactive bladder.  Treatment for overactive bladder depends on the cause and severity of your condition.  Making lifestyle changes, doing Kegel exercises, keeping a log, and taking medicines can help with this condition. This information is not intended to replace advice given to you by your health care provider. Make sure you discuss any questions you have with your health care provider. Document Revised: 07/02/2020 Document Reviewed: 07/02/2020 Elsevier Patient Education  2021 Elsevier Inc.  

## 2020-12-26 ENCOUNTER — Ambulatory Visit: Payer: 59 | Admitting: Urology

## 2021-03-20 ENCOUNTER — Ambulatory Visit (INDEPENDENT_AMBULATORY_CARE_PROVIDER_SITE_OTHER): Payer: 59 | Admitting: *Deleted

## 2021-03-20 ENCOUNTER — Other Ambulatory Visit: Payer: Self-pay

## 2021-03-20 VITALS — Ht 60.0 in | Wt 179.4 lb

## 2021-03-20 DIAGNOSIS — Z1211 Encounter for screening for malignant neoplasm of colon: Secondary | ICD-10-CM

## 2021-03-20 NOTE — Progress Notes (Signed)
Gastroenterology Pre-Procedure Review  Request Date: 03/20/2021 Requesting Physician: 5 year recall, Last TCS 09/27/2015 done by Dr. Jena Gauss, normal colon  PATIENT REVIEW QUESTIONS: The patient responded to the following health history questions as indicated:    1. Diabetes Melitis: no 2. Joint replacements in the past 12 months: no 3. Major health problems in the past 3 months: yes, gerd and diarrhea 4. Has an artificial valve or MVP:  5. Has a defibrillator:  6. Has been advised in past to take antibiotics in advance of a procedure like teeth cleaning:  7. Family history of colon cancer:   8. Alcohol Use:  9. Illicit drug Use:  10. History of sleep apnea:   11. History of coronary artery or other vascular stents placed within the last 12 months:  12. History of any prior anesthesia complications:  13. Body mass index is 35.04 kg/m.    MEDICATIONS & ALLERGIES:    Patient reports the following regarding taking any blood thinners:   Plavix? no Aspirin? no Coumadin? no Brilinta? no Xarelto? no Eliquis? no Pradaxa? no Savaysa? no Effient? no  Patient confirms/reports the following medications:  Current Outpatient Medications  Medication Sig Dispense Refill  . Ascorbic Acid (VITA-C PO) Take by mouth daily.    Marland Kitchen CINNAMON PO Take by mouth daily.    Marland Kitchen L-Methylfolate-B6-B12 (VITACIRC-B PO) Take by mouth daily.    Marland Kitchen loperamide (IMODIUM) 2 MG capsule Take 2 mg by mouth as needed.    Marland Kitchen VITAMIN D PO Take by mouth daily.     No current facility-administered medications for this visit.    Patient confirms/reports the following allergies:  No Known Allergies  No orders of the defined types were placed in this encounter.   AUTHORIZATION INFORMATION Primary Insurance: [facility],  [place] #: [id number],  Group #: [id number] Pre-Cert / [place] required: Pre-Cert / Auth #:   SCHEDULE INFORMATION: Procedure has been scheduled as follows:  Date: , Time:  Location:   This Gastroenterology  Pre-Precedure Review Form is being routed to the following provider(s): Ermalinda Memos, [place]

## 2021-03-20 NOTE — Progress Notes (Signed)
Noted  

## 2021-03-20 NOTE — Progress Notes (Signed)
Pt informed me that she was having diarrhea and GERD a lot when we was going through the triage questions.  Informed her that she would need an office visit before scheduling procedure due to her problems.  Pt voiced understanding.  Scheduled ov for 03/27/2021 at 10:00.

## 2021-03-27 ENCOUNTER — Other Ambulatory Visit: Payer: Self-pay

## 2021-03-27 ENCOUNTER — Telehealth: Payer: Self-pay | Admitting: *Deleted

## 2021-03-27 ENCOUNTER — Ambulatory Visit (INDEPENDENT_AMBULATORY_CARE_PROVIDER_SITE_OTHER): Payer: 59 | Admitting: Gastroenterology

## 2021-03-27 ENCOUNTER — Encounter: Payer: Self-pay | Admitting: Gastroenterology

## 2021-03-27 VITALS — BP 111/73 | HR 64 | Temp 97.0°F | Ht 60.0 in | Wt 179.0 lb

## 2021-03-27 DIAGNOSIS — Z800 Family history of malignant neoplasm of digestive organs: Secondary | ICD-10-CM

## 2021-03-27 DIAGNOSIS — Z8371 Family history of colonic polyps: Secondary | ICD-10-CM | POA: Diagnosis not present

## 2021-03-27 DIAGNOSIS — K219 Gastro-esophageal reflux disease without esophagitis: Secondary | ICD-10-CM | POA: Diagnosis not present

## 2021-03-27 DIAGNOSIS — K529 Noninfective gastroenteritis and colitis, unspecified: Secondary | ICD-10-CM | POA: Diagnosis not present

## 2021-03-27 MED ORDER — PEG 3350-KCL-NA BICARB-NACL 420 G PO SOLR: ORAL | 0 refills | Status: DC

## 2021-03-27 NOTE — Progress Notes (Signed)
Primary Care Physician:  Assunta Found, MD  Primary Gastroenterologist:  Roetta Sessions, MD   Chief Complaint  Patient presents with  . Diarrhea    Mostly daily, but if takes a lot of vitamins will stop for few days. Take imodium/pepto if needed  . Gastroesophageal Reflux    Worse at night and "reflux starts to foam"    HPI:  Samantha [name] is a 55 y.o. female here to schedule her colonoscopy given family history of colon polyps, and to evaluate for reflux symptoms.  Patient last seen in 2017.  She has chronic diarrhea dating back to teenage years.  Diagnosed with IBS previously.  Ileocolonoscopy in December 2016 was unremarkable.  Random colon biopsies negative.  Celiac serologies previously negative.  Patient states she is due for colonoscopy.  Her dad had a history of colon polyps before the age of 39 and subsequently diagnosed with colon cancer at age 35.  She believes he died of undiagnosed recurrent colon cancer.  As far as her chronic diarrhea, she has usually less than 5 stools a day.  Stools are often loose to watery.  Associated with abdominal cramping preceding bowel movement. Cramping improves/resolves after BM.  She tries to avoid dicyclomine use.  Feels like it just slows things down it makes her feel worse.  Uses only as needed.  Typically goes more for Imodium if needed.  Now that she is working from home, she only needs Imodium if she is going out.  She tries to limit medication use because she was told that her liver numbers were slightly abnormal and she wants to protect her liver is much as possible. No n/v. Wakes up at night with foam, heartburn. Sometimes eats late and lays down shortly after but tries not to do this often. Symptoms for 5-6 months.  Never really had reflux symptoms before.  Trying to lay off bread, seems to trigger. Doesn't do a lot of spicy foods. Nexium 20mg  daily prn. Seems to help some. Tries not to take medication regular, fearful of affect on liver.    Liver looked ok on 2015 CT.     Current Outpatient Medications  Medication Sig Dispense Refill  . Ascorbic Acid (VITA-C PO) Take by mouth. Few times a week    . CINNAMON PO Take by mouth. Few times a week    . esomeprazole (NEXIUM) 20 MG capsule Take 20 mg by mouth daily as needed.    2016 L-Methylfolate-B6-B12 (VITACIRC-B PO) Take by mouth. Few times per week    . loperamide (IMODIUM) 2 MG capsule Take 2 mg by mouth as needed.    Marland Kitchen VITAMIN D PO Take by mouth. Few times per week     No current facility-administered medications for this visit.    Allergies as of 03/27/2021  . (No Known Allergies)    Past Medical History:  Diagnosis Date  . IBS (irritable bowel syndrome)     Past Surgical History:  Procedure Laterality Date  . APPENDECTOMY    . CHOLECYSTECTOMY  2000   New 05/27/2021  . COLONOSCOPY  2004   Dr. 2005: normal ileoscolonoscopy, internal hemorrhodis  . COLONOSCOPY  2009   [facility] 2010.  . COLONOSCOPY N/A 09/27/2015   RMR: Normal ileo-colonoscopy staus post biopsy and stool sampling    Family History  Problem Relation Age of Onset  . Colon cancer Father        New 14/10/2014, around age 40, colon polyps. diagosed with colon cancer arournd age 82  .  Inflammatory bowel disease Neg Hx   . Celiac disease Neg Hx     Social History   Socioeconomic History  . Marital status: Married    Spouse name: Not on file  . Number of children: 1  . Years of education: Not on file  . Highest education level: Not on file  Occupational History  . Not on file  Tobacco Use  . Smoking status: Never Smoker  . Smokeless tobacco: Never Used  Substance and Sexual Activity  . Alcohol use: No    Alcohol/week: 0.0 standard drinks  . Drug use: No  . Sexual activity: Not on file  Other Topics Concern  . Not on file  Social History Narrative  . Not on file   Social Determinants of Health   Financial Resource Strain: Not on file  Food Insecurity: Not on file  Transportation  Needs: Not on file  Physical Activity: Not on file  Stress: Not on file  Social Connections: Not on file  Intimate Partner Violence: Not on file      ROS:  General: Negative for anorexia, weight loss, fever, chills, fatigue, weakness.  Weight is up 25 pounds in the past 5 years. Eyes: Negative for vision changes.  ENT: Negative for hoarseness, difficulty swallowing , nasal congestion. CV: Negative for chest pain, angina, palpitations, dyspnea on exertion, peripheral edema.  Respiratory: Negative for dyspnea at rest, dyspnea on exertion, cough, sputum, wheezing.  GI: See history of present illness. GU:  Negative for dysuria, hematuria, urinary incontinence, positive chronic urinary frequency, no nocturnal urination.  MS: Negative for joint pain, low back pain.  Derm: Negative for rash or itching.  Neuro: Negative for weakness, abnormal sensation, seizure, frequent headaches, memory loss, confusion.  Psych: Negative for anxiety, depression, suicidal ideation, hallucinations.  Endo: Negative for unusual weight change.  Heme: Negative for bruising or bleeding. Allergy: Negative for rash or hives.    Physical Examination:  BP 111/73   Pulse 64   Temp (!) 97 F (36.1 C)   Ht 5' (1.524 m)   Wt 179 lb (81.2 kg)   LMP 09/13/2015   BMI 34.96 kg/m    General: Well-nourished, well-developed in no acute distress.  Head: Normocephalic, atraumatic.   Eyes: Conjunctiva pink, no icterus. Mouth: masked Neck: Supple without thyromegaly, masses, or lymphadenopathy.  Lungs: Clear to auscultation bilaterally.  Heart: Regular rate and rhythm, no murmurs rubs or gallops.  Abdomen: Bowel sounds are normal, nontender, nondistended, no hepatosplenomegaly or masses, no abdominal bruits or    hernia , no rebound or guarding.   Rectal: Deferred Extremities: No lower extremity edema. No clubbing or deformities.  Neuro: Alert and oriented x 4 , grossly normal neurologically.  Skin: Warm and dry, no  rash or jaundice.   Psych: Alert and cooperative, normal mood and affect.  Labs: Requested  Imaging Studies: No results found.   Assessment: Pleasant 55 year old [place] female, presenting to schedule high rescreening colonoscopy and to discuss recent onset reflux symptoms.  Father had colon polyps before the age of 37, subsequently diagnosed with colon cancer in his mid 74s.  Patient's last colonoscopy December 2016, normal including normal ileum, negative random colon biopsies.  Patient has chronic diarrhea dating back to her teenage years.  Diagnosed with IBS-C.  Diarrhea improved with Imodium.  Suspect ongoing symptoms IBS related.  Consider relook at her terminal ileum, consider random colon biopsies at time of colonoscopy to make sure no additional factors contributing to her chronic diarrhea.  Presents with several month history of new onset reflux symptoms, primarily nocturnal symptoms.  Vomiting, dysphagia.  Some improvement with over-the-counter Nexium as needed.  Some associated upper abdominal pain.  No NSAIDs.   Plan: 1. Plan for colonoscopy and upper endoscopy in the near future with propofol. ASA II.  I have discussed the risks, alternatives, benefits with regards to but not limited to the risk of reaction to medication, bleeding, infection, perforation and the patient is agreeable to proceed. Written consent to be obtained. 2. Encouraged use of Nexium 20 mg daily (over-the-counter) for the next couple of weeks.  Patient has been hesitant in using daily, trying to limit use of medication for fear of harm to her liver. 3. Obtain the most recent labs from PCP for review regarding liver abnormalities.  Further recommendations to follow.

## 2021-03-27 NOTE — Progress Notes (Signed)
CC'ED TO PCP 

## 2021-03-27 NOTE — Telephone Encounter (Signed)
LMOVM to call back to schedule procedures 

## 2021-03-27 NOTE — Telephone Encounter (Signed)
Spoke with pt. She has been scheduled for TCS/EGD (possible ileocolonoscopy/random colon biopsies) with propofol ASA 2. Wanted 1st available and per Verlon Au okay to schedule with Dr. Marletta Lor. She has been scheduled for 6/27 at 2:15pm. Aware will mail prep instructions and send rx to pharmacy.

## 2021-03-27 NOTE — Patient Instructions (Addendum)
1. Upper endoscopy and colonoscopy as scheduled.  See separate instructions. 2. Consider taking Nexium daily for a couple of weeks to see if you can get your reflux symptoms better controlled. 3. I will request most recent labs from your PCP for review.  Further recommendations to follow if anything additional needed regarding liver numbers.  At [facility] we value your feedback. You may receive a survey about your visit today. Please share your experience as we strive to create trusting relationships with our patients to provide genuine, compassionate, quality care.   We appreciate your understanding and patience as we review any laboratory studies, imaging, and other diagnostic tests that are ordered as we care for you. Our office policy is 5 business days for review of these results, and any emergent or urgent results are addressed in a timely manner for your best interest. If you do not hear from our office in 1 week, please contact us.    We also encourage the use of MyChart, which contains your medical information for your review as well. If you are not enrolled in this feature, an access code is on this after visit summary for your convenience. Thank you for allowing [place] to be involved in your care.

## 2021-03-27 NOTE — H&P (View-Only) (Signed)
Primary Care Physician:  Assunta Found, MD  Primary Gastroenterologist:  Roetta Sessions, MD   Chief Complaint  Patient presents with  . Diarrhea    Mostly daily, but if takes a lot of vitamins will stop for few days. Take imodium/pepto if needed  . Gastroesophageal Reflux    Worse at night and "reflux starts to foam"    HPI:  Samantha [name] is a 55 y.o. female here to schedule her colonoscopy given family history of colon polyps, and to evaluate for reflux symptoms.  Patient last seen in 2017.  She has chronic diarrhea dating back to teenage years.  Diagnosed with IBS previously.  Ileocolonoscopy in December 2016 was unremarkable.  Random colon biopsies negative.  Celiac serologies previously negative.  Patient states she is due for colonoscopy.  Her dad had a history of colon polyps before the age of 39 and subsequently diagnosed with colon cancer at age 35.  She believes he died of undiagnosed recurrent colon cancer.  As far as her chronic diarrhea, she has usually less than 5 stools a day.  Stools are often loose to watery.  Associated with abdominal cramping preceding bowel movement. Cramping improves/resolves after BM.  She tries to avoid dicyclomine use.  Feels like it just slows things down it makes her feel worse.  Uses only as needed.  Typically goes more for Imodium if needed.  Now that she is working from home, she only needs Imodium if she is going out.  She tries to limit medication use because she was told that her liver numbers were slightly abnormal and she wants to protect her liver is much as possible. No n/v. Wakes up at night with foam, heartburn. Sometimes eats late and lays down shortly after but tries not to do this often. Symptoms for 5-6 months.  Never really had reflux symptoms before.  Trying to lay off bread, seems to trigger. Doesn't do a lot of spicy foods. Nexium 20mg  daily prn. Seems to help some. Tries not to take medication regular, fearful of affect on liver.    Liver looked ok on 2015 CT.     Current Outpatient Medications  Medication Sig Dispense Refill  . Ascorbic Acid (VITA-C PO) Take by mouth. Few times a week    . CINNAMON PO Take by mouth. Few times a week    . esomeprazole (NEXIUM) 20 MG capsule Take 20 mg by mouth daily as needed.    2016 L-Methylfolate-B6-B12 (VITACIRC-B PO) Take by mouth. Few times per week    . loperamide (IMODIUM) 2 MG capsule Take 2 mg by mouth as needed.    Marland Kitchen VITAMIN D PO Take by mouth. Few times per week     No current facility-administered medications for this visit.    Allergies as of 03/27/2021  . (No Known Allergies)    Past Medical History:  Diagnosis Date  . IBS (irritable bowel syndrome)     Past Surgical History:  Procedure Laterality Date  . APPENDECTOMY    . CHOLECYSTECTOMY  2000   New 05/27/2021  . COLONOSCOPY  2004   Dr. 2005: normal ileoscolonoscopy, internal hemorrhodis  . COLONOSCOPY  2009   [facility] 2010.  . COLONOSCOPY N/A 09/27/2015   RMR: Normal ileo-colonoscopy staus post biopsy and stool sampling    Family History  Problem Relation Age of Onset  . Colon cancer Father        New 14/10/2014, around age 40, colon polyps. diagosed with colon cancer arournd age 82  .  Inflammatory bowel disease Neg Hx   . Celiac disease Neg Hx     Social History   Socioeconomic History  . Marital status: Married    Spouse name: Not on file  . Number of children: 1  . Years of education: Not on file  . Highest education level: Not on file  Occupational History  . Not on file  Tobacco Use  . Smoking status: Never Smoker  . Smokeless tobacco: Never Used  Substance and Sexual Activity  . Alcohol use: No    Alcohol/week: 0.0 standard drinks  . Drug use: No  . Sexual activity: Not on file  Other Topics Concern  . Not on file  Social History Narrative  . Not on file   Social Determinants of Health   Financial Resource Strain: Not on file  Food Insecurity: Not on file  Transportation  Needs: Not on file  Physical Activity: Not on file  Stress: Not on file  Social Connections: Not on file  Intimate Partner Violence: Not on file      ROS:  General: Negative for anorexia, weight loss, fever, chills, fatigue, weakness.  Weight is up 25 pounds in the past 5 years. Eyes: Negative for vision changes.  ENT: Negative for hoarseness, difficulty swallowing , nasal congestion. CV: Negative for chest pain, angina, palpitations, dyspnea on exertion, peripheral edema.  Respiratory: Negative for dyspnea at rest, dyspnea on exertion, cough, sputum, wheezing.  GI: See history of present illness. GU:  Negative for dysuria, hematuria, urinary incontinence, positive chronic urinary frequency, no nocturnal urination.  MS: Negative for joint pain, low back pain.  Derm: Negative for rash or itching.  Neuro: Negative for weakness, abnormal sensation, seizure, frequent headaches, memory loss, confusion.  Psych: Negative for anxiety, depression, suicidal ideation, hallucinations.  Endo: Negative for unusual weight change.  Heme: Negative for bruising or bleeding. Allergy: Negative for rash or hives.    Physical Examination:  BP 111/73   Pulse 64   Temp (!) 97 F (36.1 C)   Ht 5' (1.524 m)   Wt 179 lb (81.2 kg)   LMP 09/13/2015   BMI 34.96 kg/m    General: Well-nourished, well-developed in no acute distress.  Head: Normocephalic, atraumatic.   Eyes: Conjunctiva pink, no icterus. Mouth: masked Neck: Supple without thyromegaly, masses, or lymphadenopathy.  Lungs: Clear to auscultation bilaterally.  Heart: Regular rate and rhythm, no murmurs rubs or gallops.  Abdomen: Bowel sounds are normal, nontender, nondistended, no hepatosplenomegaly or masses, no abdominal bruits or    hernia , no rebound or guarding.   Rectal: Deferred Extremities: No lower extremity edema. No clubbing or deformities.  Neuro: Alert and oriented x 4 , grossly normal neurologically.  Skin: Warm and dry, no  rash or jaundice.   Psych: Alert and cooperative, normal mood and affect.  Labs: Requested  Imaging Studies: No results found.   Assessment: Pleasant 55 year old [place] female, presenting to schedule high rescreening colonoscopy and to discuss recent onset reflux symptoms.  Father had colon polyps before the age of 37, subsequently diagnosed with colon cancer in his mid 74s.  Patient's last colonoscopy December 2016, normal including normal ileum, negative random colon biopsies.  Patient has chronic diarrhea dating back to her teenage years.  Diagnosed with IBS-C.  Diarrhea improved with Imodium.  Suspect ongoing symptoms IBS related.  Consider relook at her terminal ileum, consider random colon biopsies at time of colonoscopy to make sure no additional factors contributing to her chronic diarrhea.  Presents with several month history of new onset reflux symptoms, primarily nocturnal symptoms.  Vomiting, dysphagia.  Some improvement with over-the-counter Nexium as needed.  Some associated upper abdominal pain.  No NSAIDs.   Plan: 1. Plan for colonoscopy and upper endoscopy in the near future with propofol. ASA II.  I have discussed the risks, alternatives, benefits with regards to but not limited to the risk of reaction to medication, bleeding, infection, perforation and the patient is agreeable to proceed. Written consent to be obtained. 2. Encouraged use of Nexium 20 mg daily (over-the-counter) for the next couple of weeks.  Patient has been hesitant in using daily, trying to limit use of medication for fear of harm to her liver. 3. Obtain the most recent labs from PCP for review regarding liver abnormalities.  Further recommendations to follow.

## 2021-03-28 ENCOUNTER — Other Ambulatory Visit: Payer: Self-pay

## 2021-03-28 ENCOUNTER — Telehealth: Payer: Self-pay

## 2021-03-28 NOTE — Telephone Encounter (Signed)
PA for TCS/EGD submitted via [facility] website. PA# [id number], valid 04/22/21-07/21/21.

## 2021-04-22 ENCOUNTER — Ambulatory Visit (HOSPITAL_COMMUNITY)
Admission: RE | Admit: 2021-04-22 | Discharge: 2021-04-22 | Disposition: A | Discharge status: Home / Self Care | Payer: 59 | Attending: Internal Medicine | Admitting: Internal Medicine

## 2021-04-22 ENCOUNTER — Encounter (HOSPITAL_COMMUNITY)
Admission: RE | Disposition: A | Discharge status: Home / Self Care | Payer: Self-pay | Source: Home / Self Care | Attending: Internal Medicine

## 2021-04-22 ENCOUNTER — Ambulatory Visit (HOSPITAL_COMMUNITY): Payer: 59 | Admitting: Anesthesiology

## 2021-04-22 ENCOUNTER — Encounter (HOSPITAL_COMMUNITY): Payer: Self-pay

## 2021-04-22 ENCOUNTER — Other Ambulatory Visit: Payer: Self-pay

## 2021-04-22 DIAGNOSIS — K648 Other hemorrhoids: Secondary | ICD-10-CM | POA: Insufficient documentation

## 2021-04-22 DIAGNOSIS — Z800 Family history of malignant neoplasm of digestive organs: Secondary | ICD-10-CM | POA: Insufficient documentation

## 2021-04-22 DIAGNOSIS — Z8371 Family history of colonic polyps: Secondary | ICD-10-CM | POA: Diagnosis not present

## 2021-04-22 DIAGNOSIS — K2970 Gastritis, unspecified, without bleeding: Secondary | ICD-10-CM | POA: Diagnosis not present

## 2021-04-22 DIAGNOSIS — K529 Noninfective gastroenteritis and colitis, unspecified: Secondary | ICD-10-CM | POA: Diagnosis not present

## 2021-04-22 DIAGNOSIS — K2980 Duodenitis without bleeding: Secondary | ICD-10-CM | POA: Insufficient documentation

## 2021-04-22 DIAGNOSIS — K635 Polyp of colon: Secondary | ICD-10-CM | POA: Diagnosis not present

## 2021-04-22 DIAGNOSIS — K219 Gastro-esophageal reflux disease without esophagitis: Secondary | ICD-10-CM | POA: Insufficient documentation

## 2021-04-22 DIAGNOSIS — K449 Diaphragmatic hernia without obstruction or gangrene: Secondary | ICD-10-CM | POA: Insufficient documentation

## 2021-04-22 DIAGNOSIS — R1310 Dysphagia, unspecified: Secondary | ICD-10-CM

## 2021-04-22 DIAGNOSIS — K5730 Diverticulosis of large intestine without perforation or abscess without bleeding: Secondary | ICD-10-CM | POA: Diagnosis not present

## 2021-04-22 DIAGNOSIS — Z9049 Acquired absence of other specified parts of digestive tract: Secondary | ICD-10-CM | POA: Diagnosis not present

## 2021-04-22 HISTORY — PX: BIOPSY: SHX5522

## 2021-04-22 HISTORY — PX: COLONOSCOPY WITH PROPOFOL: SHX5780

## 2021-04-22 HISTORY — PX: ESOPHAGOGASTRODUODENOSCOPY (EGD) WITH PROPOFOL: SHX5813

## 2021-04-22 HISTORY — DX: Other specified postprocedural states: Z98.890

## 2021-04-22 HISTORY — DX: Nausea with vomiting, unspecified: R11.2

## 2021-04-22 HISTORY — PX: POLYPECTOMY: SHX5525

## 2021-04-22 SURGERY — COLONOSCOPY WITH PROPOFOL
Anesthesia: General

## 2021-04-22 MED ORDER — PROPOFOL 10 MG/ML IV BOLUS
INTRAVENOUS | Status: DC | PRN
  Administered 2021-04-22: 100 mg via INTRAVENOUS
  Administered 2021-04-22: 125 ug/kg/min via INTRAVENOUS

## 2021-04-22 MED ORDER — STERILE WATER FOR IRRIGATION IR SOLN
Status: DC | PRN
  Administered 2021-04-22: 1.5 mL

## 2021-04-22 MED ORDER — LACTATED RINGERS IV SOLN: INTRAVENOUS | Status: DC

## 2021-04-22 MED ORDER — LIDOCAINE HCL (CARDIAC) PF 100 MG/5ML IV SOSY
PREFILLED_SYRINGE | INTRAVENOUS | Status: DC | PRN
  Administered 2021-04-22: 100 mg via INTRAVENOUS

## 2021-04-22 NOTE — Discharge Instructions (Addendum)
EGD Discharge instructions Please read the instructions outlined below and refer to this sheet in the next few weeks. These discharge instructions provide you with general information on caring for yourself after you leave the hospital. Your doctor may also give you specific instructions. While your treatment has been planned according to the most current medical practices available, unavoidable complications occasionally occur. If you have any problems or questions after discharge, please call your doctor. ACTIVITY You may resume your regular activity but move at a slower pace for the next 24 hours.  Take frequent rest periods for the next 24 hours.  Walking will help expel (get rid of) the air and reduce the bloated feeling in your abdomen.  No driving for 24 hours (because of the anesthesia (medicine) used during the test).  You may shower.  Do not sign any important legal documents or operate any machinery for 24 hours (because of the anesthesia used during the test).  NUTRITION Drink plenty of fluids.  You may resume your normal diet.  Begin with a light meal and progress to your normal diet.  Avoid alcoholic beverages for 24 hours or as instructed by your caregiver.  MEDICATIONS You may resume your normal medications unless your caregiver tells you otherwise.  WHAT YOU CAN EXPECT TODAY You may experience abdominal discomfort such as a feeling of fullness or "gas" pains.  FOLLOW-UP Your doctor will discuss the results of your test with you.  SEEK IMMEDIATE MEDICAL ATTENTION IF ANY OF THE FOLLOWING OCCUR: Excessive nausea (feeling sick to your stomach) and/or vomiting.  Severe abdominal pain and distention (swelling).  Trouble swallowing.  Temperature over 101 F (37.8 C).  Rectal bleeding or vomiting of blood.     Colonoscopy Discharge Instructions  Read the instructions outlined below and refer to this sheet in the next few weeks. These discharge instructions provide you with  general information on caring for yourself after you leave the hospital. Your doctor may also give you specific instructions. While your treatment has been planned according to the most current medical practices available, unavoidable complications occasionally occur.   ACTIVITY You may resume your regular activity, but move at a slower pace for the next 24 hours.  Take frequent rest periods for the next 24 hours.  Walking will help get rid of the air and reduce the bloated feeling in your belly (abdomen).  No driving for 24 hours (because of the medicine (anesthesia) used during the test).   Do not sign any important legal documents or operate any machinery for 24 hours (because of the anesthesia used during the test).  NUTRITION Drink plenty of fluids.  You may resume your normal diet as instructed by your doctor.  Begin with a light meal and progress to your normal diet. Heavy or fried foods are harder to digest and may make you feel sick to your stomach (nauseated).  Avoid alcoholic beverages for 24 hours or as instructed.  MEDICATIONS You may resume your normal medications unless your doctor tells you otherwise.  WHAT YOU CAN EXPECT TODAY Some feelings of bloating in the abdomen.  Passage of more gas than usual.  Spotting of blood in your stool or on the toilet paper.  IF YOU HAD POLYPS REMOVED DURING THE COLONOSCOPY: No aspirin products for 7 days or as instructed.  No alcohol for 7 days or as instructed.  Eat a soft diet for the next 24 hours.  FINDING OUT THE RESULTS OF YOUR TEST Not all test results are  available during your visit. If your test results are not back during the visit, make an appointment with your caregiver to find out the results. Do not assume everything is normal if you have not heard from your caregiver or the medical facility. It is important for you to follow up on all of your test results.  SEEK IMMEDIATE MEDICAL ATTENTION IF: You have more than a spotting of  blood in your stool.  Your belly is swollen (abdominal distention).  You are nauseated or vomiting.  You have a temperature over 101.  You have abdominal pain or discomfort that is severe or gets worse throughout the day.   Your EGD revealed a mild amount inflammation your stomach.  I took biopsies to rule infection a bacteria called H. pylori.  Also took biopsies of your small bowel to rule out celiac disease.  I think you should start taking your Nexium every day 30 minutes before breakfast to help with the inflammation.  Avoid NSAIDs as best as you can.  Your colonoscopy revealed 1 polyp(s) which I removed successfully. Await pathology results, my office will contact you. I recommend repeating colonoscopy in 5 years for surveillance purposes.  I did not see any inflammation indicative of underlying ulcerative colitis or Crohn's disease.  I did take biopsies throughout your entire colon to rule out a condition called microscopic colitis that can cause chronic bloating and diarrhea.  Await pathology results, my office will contact you.  Follow-up with GI in 3 months.   I hope you have a great rest of your week!  Hennie Duos. Marletta Lor, D.O. Gastroenterology and Hepatology [facility]

## 2021-04-22 NOTE — Transfer of Care (Signed)
Immediate Anesthesia Transfer of Care Note  Patient: Samantha [name]  Procedure(s) Performed: COLONOSCOPY WITH PROPOFOL ESOPHAGOGASTRODUODENOSCOPY (EGD) WITH PROPOFOL BIOPSY POLYPECTOMY  Patient Location: Endoscopy Unit  Anesthesia Type:General  Level of Consciousness: awake, alert , oriented and patient cooperative  Airway & Oxygen Therapy: Patient Spontanous Breathing  Post-op Assessment: Report given to RN, Post -op Vital signs reviewed and stable and Patient moving all extremities  Post vital signs: Reviewed and stable  Last Vitals:  Vitals Value Taken Time  BP    Temp    Pulse    Resp    SpO2      Last Pain:  Vitals:   04/22/21 1356  TempSrc:   PainSc: 0-No pain      Patients Stated Pain Goal: 2 (04/22/21 1320)  Complications: No notable events documented.

## 2021-04-22 NOTE — Anesthesia Preprocedure Evaluation (Addendum)
Anesthesia Evaluation  Patient identified by MRN, date of birth, ID band Patient awake    Reviewed: Allergy & Precautions, NPO status , Patient's Chart, lab work & pertinent test results  History of Anesthesia Complications (+) PONV and history of anesthetic complications  Airway Mallampati: III  TM Distance: <3 FB Neck ROM: Full  Mouth opening: Limited Mouth Opening Comment: Mandibular fx Dental  (+) Dental Advisory Given,    Pulmonary neg pulmonary ROS,    Pulmonary exam normal breath sounds clear to auscultation       Cardiovascular Exercise Tolerance: Good Normal cardiovascular exam Rhythm:Regular Rate:Normal     Neuro/Psych negative neurological ROS  negative psych ROS   GI/Hepatic Neg liver ROS, GERD  Medicated and Controlled,  Endo/Other  negative endocrine ROS  Renal/GU negative Renal ROS     Musculoskeletal Mandibular fx   Abdominal   Peds  Hematology negative hematology ROS (+)   Anesthesia Other Findings Mandibular fx Snoring   Reproductive/Obstetrics negative OB ROS                          Anesthesia Physical Anesthesia Plan  ASA: 2  Anesthesia Plan: General   Post-op Pain Management:    Induction: Intravenous  PONV Risk Score and Plan: Propofol infusion  Airway Management Planned: Nasal Cannula and Natural Airway  Additional Equipment:   Intra-op Plan:   Post-operative Plan:   Informed Consent: I have reviewed the patients History and Physical, chart, labs and discussed the procedure including the risks, benefits and alternatives for the proposed anesthesia with the patient or authorized representative who has indicated his/her understanding and acceptance.     Dental advisory given  Plan Discussed with: CRNA and Surgeon  Anesthesia Plan Comments:         Anesthesia Quick Evaluation

## 2021-04-22 NOTE — Op Note (Signed)
[facility] Patient Name: Samantha [name] Procedure Date: 04/22/2021 2:10 PM MRN: [id number] Date of Birth: 06/21/1966 Attending MD: Elon Alas. Abbey Chatters DO CSN: [id number] Age: 55 Admit Type: Outpatient Procedure:                Colonoscopy Indications:              Chronic diarrhea Providers:                Elon Alas. Abbey Chatters, DO, Caprice Kluver, Raphael Gibney,                            Technician Referring MD:              Medicines:                See the Anesthesia note for documentation of the                            administered medications Complications:            No immediate complications. Estimated Blood Loss:     Estimated blood loss was minimal. Procedure:                Pre-Anesthesia Assessment:                           - The anesthesia plan was to use monitored                            anesthesia care (MAC).                           After obtaining informed consent, the colonoscope                            was passed under direct vision. Throughout the                            procedure, the patient's blood pressure, pulse, and                            oxygen saturations were monitored continuously. The                            [id number]-[id number] ([id number]) scope was introduced through                            the anus and advanced to the the terminal ileum,                            with identification of the appendiceal orifice and                            IC valve. The colonoscopy was performed without                            difficulty. The patient tolerated the procedure  well. The quality of the bowel preparation was                            evaluated using the BBPS [facility] Bowel Preparation                            Scale) with scores of: Right Colon = 3, Transverse                            Colon = 3 and Left Colon = 3 (entire mucosa seen                            well with no residual staining, small fragments of                             stool or opaque liquid). The total BBPS score                            equals 9. Scope In: 2:11:23 PM Scope Out: 2:25:01 PM Scope Withdrawal Time: 0 hours 9 minutes 36 seconds  Total Procedure Duration: 0 hours 13 minutes 38 seconds  Findings:      The perianal and digital rectal examinations were normal.      Non-bleeding internal hemorrhoids were found during endoscopy.      Multiple small-mouthed diverticula were found in the sigmoid colon.      A 2 mm polyp was found in the ascending colon. The polyp was sessile.       The polyp was removed with a cold biopsy forceps. Resection and       retrieval were complete.      The terminal ileum appeared normal.      Biopsies for histology were taken with a cold forceps from the ascending       colon, transverse colon and descending colon for evaluation of       microscopic colitis. Impression:               - Non-bleeding internal hemorrhoids.                           - Diverticulosis in the sigmoid colon.                           - One 2 mm polyp in the ascending colon, removed                            with a cold biopsy forceps. Resected and retrieved.                           - The examined portion of the ileum was normal.                           - Biopsies were taken with a cold forceps from the                            ascending  colon, transverse colon and descending                            colon for evaluation of microscopic colitis. Moderate Sedation:      Per Anesthesia Care Recommendation:           - Patient has a contact number available for                            emergencies. The signs and symptoms of potential                            delayed complications were discussed with the                            patient. Return to normal activities tomorrow.                            Written discharge instructions were provided to the                            patient.                            - Resume previous diet.                           - Continue present medications.                           - Await pathology results.                           - Repeat colonoscopy in 5 years for surveillance                            and family history of colon cancer.                           - Return to GI clinic in 3 months. Procedure Code(s):        --- Professional ---                           [phone], Colonoscopy, flexible; with biopsy, single                            or multiple Diagnosis Code(s):        --- Professional ---                           K64.8, Other hemorrhoids                           K63.5, Polyp of colon                           K52.9, Noninfective gastroenteritis and colitis,  unspecified                           K57.30, Diverticulosis of large intestine without                            perforation or abscess without bleeding CPT copyright 2019 American Medical Association. All rights reserved. The codes documented in this report are preliminary and upon coder review may  be revised to meet current compliance requirements. Elon Alas. Abbey Chatters, DO [place] Abbey Chatters, DO 04/22/2021 2:28:53 PM This report has been signed electronically. Number of Addenda: 0

## 2021-04-22 NOTE — Op Note (Signed)
[facility] Patient Name: Samantha [name] Procedure Date: 04/22/2021 1:50 PM MRN: [id number] Date of Birth: [birth year] Attending MD: Elon Alas. Abbey Chatters DO CSN: [id number] Age: 55 Admit Type: Outpatient Procedure:                Upper GI endoscopy Indications:              Dysphagia, Heartburn Providers:                Elon Alas. Abbey Chatters, DO, Caprice Kluver, Raphael Gibney,                            Technician Referring MD:              Medicines:                See the Anesthesia note for documentation of the                            administered medications Complications:            No immediate complications. Estimated Blood Loss:     Estimated blood loss was minimal. Procedure:                Pre-Anesthesia Assessment:                           - The anesthesia plan was to use monitored                            anesthesia care (MAC).                           After obtaining informed consent, the endoscope was                            passed under direct vision. Throughout the                            procedure, the patient's blood pressure, pulse, and                            oxygen saturations were monitored continuously. The                            [id number]) was introduced through the mouth,                            and advanced to the second part of duodenum. The                            upper GI endoscopy was accomplished without                            difficulty. The patient tolerated the procedure                            well. Scope In: 2:01:19 PM Scope Out: 2:06:23 PM  Total Procedure Duration: 0 hours 5 minutes 4 seconds  Findings:      There is no endoscopic evidence of bleeding, areas of erosion, stenosis,       stricture, ulcerations or varices in the entire esophagus.      A small hiatal hernia was present.      Diffuse mild inflammation characterized by erythema was found in the       entire examined stomach. Biopsies were taken  with a cold forceps for       Helicobacter pylori testing.      The duodenal bulb, first portion of the duodenum and second portion of       the duodenum were normal. Biopsies for histology were taken with a cold       forceps for evaluation of celiac disease. Impression:               - Small hiatal hernia.                           - Gastritis. Biopsied.                           - Normal duodenal bulb, first portion of the                            duodenum and second portion of the duodenum.                            Biopsied. Moderate Sedation:      Per Anesthesia Care Recommendation:           - Patient has a contact number available for                            emergencies. The signs and symptoms of potential                            delayed complications were discussed with the                            patient. Return to normal activities tomorrow.                            Written discharge instructions were provided to the                            patient.                           - Resume previous diet.                           - Continue present medications.                           - Await pathology results.                           - Return to GI clinic in 3 months.                           -  Use Nexium (esomeprazole) 20 mg PO daily. Procedure Code(s):        --- Professional ---                           [phone], Esophagogastroduodenoscopy, flexible,                            transoral; with biopsy, single or multiple Diagnosis Code(s):        --- Professional ---                           K44.9, Diaphragmatic hernia without obstruction or                            gangrene                           K29.70, Gastritis, unspecified, without bleeding                           R13.10, Dysphagia, unspecified                           R12, Heartburn CPT copyright 2019 American Medical Association. All rights reserved. The codes documented in this report are  preliminary and upon coder review may  be revised to meet current compliance requirements. Elon Alas. Abbey Chatters, DO [place] Abbey Chatters, DO 04/22/2021 2:08:42 PM This report has been signed electronically. Number of Addenda: 0

## 2021-04-22 NOTE — Anesthesia Postprocedure Evaluation (Signed)
Anesthesia Post Note  Patient: Samantha [name]  Procedure(s) Performed: COLONOSCOPY WITH PROPOFOL ESOPHAGOGASTRODUODENOSCOPY (EGD) WITH PROPOFOL BIOPSY POLYPECTOMY  Patient location during evaluation: Endoscopy Anesthesia Type: General Level of consciousness: awake and alert and oriented Pain management: pain level controlled Vital Signs Assessment: post-procedure vital signs reviewed and stable Respiratory status: spontaneous breathing and respiratory function stable Cardiovascular status: blood pressure returned to baseline and stable Postop Assessment: no apparent nausea or vomiting Anesthetic complications: no   No notable events documented.   Last Vitals:  Vitals:   04/22/21 1429 04/22/21 1435  BP:  98/63  Pulse: 80   Resp: 12   Temp: (!) 36.3 C   SpO2: 95%     Last Pain:  Vitals:   04/22/21 1429  TempSrc: Oral  PainSc: 0-No pain                 Ed Rayson C Ahliyah Nienow

## 2021-04-22 NOTE — Interval H&P Note (Signed)
History and Physical Interval Note:  04/22/2021 1:29 PM  Samantha [name]  has presented today for surgery, with the diagnosis of FH CRC, FH colon polyps, GERD, chronic diarrhea.  The various methods of treatment have been discussed with the patient and family. After consideration of risks, benefits and other options for treatment, the patient has consented to  Procedure(s) with comments: COLONOSCOPY WITH PROPOFOL (N/A) - 2:15pm needs Colonoscopy with possible ileocolonoscopy/Random colon biopsies ESOPHAGOGASTRODUODENOSCOPY (EGD) WITH PROPOFOL (N/A) as a surgical intervention.  The patient's history has been reviewed, patient examined, no change in status, stable for surgery.  I have reviewed the patient's chart and labs.  Questions were answered to the patient's satisfaction.     Lanelle Bal

## 2021-04-25 LAB — SURGICAL PATHOLOGY

## 2021-04-26 ENCOUNTER — Encounter (HOSPITAL_COMMUNITY): Payer: Self-pay | Admitting: Internal Medicine

## 2021-05-01 ENCOUNTER — Telehealth: Payer: Self-pay | Admitting: Internal Medicine

## 2021-05-01 ENCOUNTER — Other Ambulatory Visit: Payer: Self-pay | Admitting: *Deleted

## 2021-05-01 MED ORDER — AMOXICILL-CLARITHRO-LANSOPRAZ PO MISC: Freq: Two times a day (BID) | ORAL | 0 refills | Status: AC

## 2021-05-01 NOTE — Telephone Encounter (Signed)
PATIENT CAME IN OFFICE ASKING ABOUT PROCEDURE RESULTS,  PLEASE CALL LATER TODAY IF THEY ARE COMPLETE

## 2021-05-01 NOTE — Telephone Encounter (Signed)
Spoke to pt and made her aware of results and recommendations.  See path result note.

## 2021-05-01 NOTE — Progress Notes (Signed)
pr

## 2021-05-05 ENCOUNTER — Telehealth: Payer: Self-pay | Admitting: Gastroenterology

## 2021-05-05 DIAGNOSIS — R7989 Other specified abnormal findings of blood chemistry: Secondary | ICD-10-CM

## 2021-05-05 DIAGNOSIS — R945 Abnormal results of liver function studies: Secondary | ICD-10-CM

## 2021-05-05 NOTE — Telephone Encounter (Signed)
Please let pt know I reviewed her labs sent by PCP. They were dated 08/2019. ALT slightly elevated at 55  Would offer her updated labs to include: LFTs, iron/tibc/ferritin, Hep B surface antigen, Hep C antibody

## 2021-05-08 NOTE — Telephone Encounter (Signed)
Spoke to nurse at [facility] and asked them to fax over most recent lab results.

## 2021-05-10 NOTE — Addendum Note (Signed)
Addended by: Tiffany Kocher on: 05/10/2021 02:34 PM   Modules accepted: Orders

## 2021-05-10 NOTE — Telephone Encounter (Signed)
Received additional labs dated 05/01/2021: White blood cell count 5600, hemoglobin 13.9, MCV 90, platelets 283,000, BUN 8, creatinine 0.63, albumin 4.7, total bilirubin 0.5, alkaline phosphatase 79, AST 41, ALT 69, A1c 5.4.   Need additional labs to evaluate abnormal LFTs:  Please arrange for her to have (I placed orders) Iron/TIBC/ferritin Hepatitis B surface antigen Hepatitis C antibody TTG IgA IgG/IgM/IgA ANA ASMA AMA Anti liver/kidney

## 2021-05-13 NOTE — Telephone Encounter (Signed)
Lmom for pt. To call office back. 

## 2021-05-14 NOTE — Telephone Encounter (Signed)
Spoke to pt. Informed her of lab results and recommendations. Informed her that Tana Coast PA-C would like additional labs. Pt. Voiced that she understood. Informer her that she could go to [facility] to have labs drawn.

## 2021-05-26 LAB — IRON,TIBC AND FERRITIN PANEL
Ferritin: 269 ng/mL — ABNORMAL HIGH (ref 15–150)
Iron Saturation: 23 % (ref 15–55)
Iron: 86 ug/dL (ref 27–159)
Total Iron Binding Capacity: 368 ug/dL (ref 250–450)
UIBC: 282 ug/dL (ref 131–425)

## 2021-05-26 LAB — IGG, IGA, IGM
IgA/Immunoglobulin A, Serum: 462 mg/dL — ABNORMAL HIGH (ref 87–352)
IgG (Immunoglobin G), Serum: 1800 mg/dL — ABNORMAL HIGH (ref 586–1602)
IgM (Immunoglobulin M), Srm: 284 mg/dL — ABNORMAL HIGH (ref 26–217)

## 2021-05-26 LAB — HEPATITIS B SURFACE ANTIGEN: Hepatitis B Surface Ag: NEGATIVE

## 2021-05-26 LAB — HEPATITIS C ANTIBODY: Hep C Virus Ab: 0.1 s/co ratio (ref 0.0–0.9)

## 2021-05-26 LAB — MITOCHONDRIAL/SMOOTH MUSCLE AB PNL
Mitochondrial Ab: <20 Units (ref 0.0–20.0)
Smooth Muscle Ab: 6 Units (ref 0–19)

## 2021-05-26 LAB — ANA: Anti Nuclear Antibody (ANA): NEGATIVE

## 2021-05-26 LAB — ANTI-MICROSOMAL ANTIBODY LIVER / KIDNEY: LKM1 Ab: 1.1 Units (ref 0.0–20.0)

## 2021-05-26 LAB — TISSUE TRANSGLUTAMINASE, IGA: Transglutaminase IgA: 3 U/mL (ref 0–3)

## 2021-06-04 ENCOUNTER — Encounter: Payer: Self-pay | Admitting: *Deleted

## 2021-06-24 ENCOUNTER — Other Ambulatory Visit: Payer: Self-pay | Admitting: Internal Medicine

## 2021-06-24 ENCOUNTER — Other Ambulatory Visit: Payer: Self-pay

## 2021-06-24 ENCOUNTER — Ambulatory Visit (HOSPITAL_COMMUNITY)
Admission: RE | Admit: 2021-06-24 | Discharge: 2021-06-24 | Disposition: A | Discharge status: Home / Self Care | Payer: 59 | Source: Ambulatory Visit | Attending: Internal Medicine | Admitting: Internal Medicine

## 2021-06-24 DIAGNOSIS — M79604 Pain in right leg: Secondary | ICD-10-CM

## 2021-06-27 ENCOUNTER — Encounter: Payer: Self-pay | Admitting: Internal Medicine

## 2021-08-12 ENCOUNTER — Ambulatory Visit: Payer: 59 | Admitting: Gastroenterology

## 2021-11-18 ENCOUNTER — Ambulatory Visit (INDEPENDENT_AMBULATORY_CARE_PROVIDER_SITE_OTHER): Payer: 59 | Admitting: Gastroenterology

## 2021-11-18 ENCOUNTER — Other Ambulatory Visit: Payer: Self-pay

## 2021-11-18 ENCOUNTER — Encounter: Payer: Self-pay | Admitting: Gastroenterology

## 2021-11-18 VITALS — BP 115/74 | HR 78 | Temp 96.9°F | Ht 60.0 in | Wt 185.4 lb

## 2021-11-18 DIAGNOSIS — Z800 Family history of malignant neoplasm of digestive organs: Secondary | ICD-10-CM

## 2021-11-18 DIAGNOSIS — K219 Gastro-esophageal reflux disease without esophagitis: Secondary | ICD-10-CM

## 2021-11-18 DIAGNOSIS — Z8371 Family history of colonic polyps: Secondary | ICD-10-CM | POA: Diagnosis not present

## 2021-11-18 DIAGNOSIS — K2970 Gastritis, unspecified, without bleeding: Secondary | ICD-10-CM

## 2021-11-18 DIAGNOSIS — R7989 Other specified abnormal findings of blood chemistry: Secondary | ICD-10-CM

## 2021-11-18 DIAGNOSIS — B9681 Helicobacter pylori [H. pylori] as the cause of diseases classified elsewhere: Secondary | ICD-10-CM | POA: Insufficient documentation

## 2021-11-18 NOTE — Progress Notes (Signed)
Primary Care Physician: Sharilyn Sites, MD  Primary Gastroenterologist:  Garfield Cornea, MD   Chief Complaint  Patient presents with   Gastroesophageal Reflux   Abdominal Pain    Mid upper abd. Doesn't feel like infection is completely gone    HPI: Samantha [name] is a 56 y.o. female here for follow-up.  She has history of IBS with diarrhea dating back to her teenage years. She has GERD, nocturnal symptoms, and epigastric pain.   Since her last office visit she had EGD and colonoscopy as outlined below.  She was treated for H. Pylori (Prevpac). Advised next colonoscopy in 10 years but really this should be 5 years due to family history of colon polyps in a first-degree relative before the age of 55 subsequently developing colon cancer. Since her last visit she also found out that she had two paternal aunts with colon cancer (one in their 29s, the other in her 44s).   Took H.pylori treatment. Felt good for awhile but symptoms starting back. Wake up at night with heartburn and stomach ache. During day feels okay. Some daytime symptoms with certain food, bread/ginger ale. Mostly night time symptoms. Only takes Nexium 20mg  daily as needed. Usually if she wakes up with symptoms. Tries to limit medication intake due to "my liver". Finds prn Nexium helpful. BM 2-3 times per day and sometimes none. Will take imodium sparingly. Since she works from home she does not have to take very often. No N/V. No dysphagia. No weight loss. Weight up about 5 pounds.   Labs from July 2022 with unremarkable CBC, creatinine.  LFTs normal except for AST of 41, ALT 69.  Additional labs completed showing elevation of IgG at 1800, IgM 284, IgA at 462, ferritin elevated at 269 with normal iron and iron saturations.  Hepatitis B surface antigen and hepatitis C antibody both negative.  Celiac serologies negative.  ANA, AMA, smooth muscle antibody, LKM1 all negative.    EGD June 2022: - Small hiatal hernia. -  Gastritis. Biopsied.  Chronic active gastritis, H. pylori present. - Normal duodenal bulb, first portion of the duodenum and second portion of the duodenum. Biopsied.  Peptic duodenitis.  Colonoscopy June 2022: - Non-bleeding internal hemorrhoids. - Diverticulosis in the sigmoid colon. - One 2 mm polyp in the ascending colon, removed with a cold biopsy forceps. Resected and retrieved.  Polypoid fragment of benign colonic mucosa with underlying lymphoid aggregates with features suggesting lymphoproliferative process. - The examined portion of the ileum was normal. - Biopsies were taken with a cold forceps from the ascending colon, transverse colon and descending colon for evaluation of microscopic colitis.  Benign colonic mucosa.  Current Outpatient Medications  Medication Sig Dispense Refill   esomeprazole (NEXIUM) 20 MG capsule Take 20 mg by mouth daily as needed (acid reflux).     loperamide (IMODIUM) 2 MG capsule Take 2 mg by mouth daily as needed for diarrhea or loose stools.            No current facility-administered medications for this visit.    Allergies as of 11/18/2021   (No Known Allergies)    ROS:  General: Negative for anorexia, weight loss, fever, chills, fatigue, weakness. ENT: Negative for hoarseness, difficulty swallowing , nasal congestion. CV: Negative for chest pain, angina, palpitations, dyspnea on exertion, peripheral edema.  Respiratory: Negative for dyspnea at rest, dyspnea on exertion, cough, sputum, wheezing.  GI: See history of present illness. GU:  Negative for dysuria, hematuria, urinary  incontinence, urinary frequency, nocturnal urination.  Endo: Negative for unusual weight change.    Physical Examination:   BP 115/74    Pulse 78    Temp (!) 96.9 F (36.1 C) (Temporal)    Ht 5' (1.524 m)    Wt 185 lb 6.4 oz (84.1 kg)    LMP 09/13/2015    BMI 36.21 kg/m   General: Well-nourished, well-developed in no acute distress.  Eyes: No icterus. Mouth:  masked Lungs: Clear to auscultation bilaterally.  Heart: Regular rate and rhythm, no murmurs rubs or gallops.  Abdomen: Bowel sounds are normal, nontender, nondistended, no hepatosplenomegaly or masses, no abdominal bruits or hernia , no rebound or guarding.   Extremities: No lower extremity edema. No clubbing or deformities. Neuro: Alert and oriented x 4   Skin: Warm and dry, no jaundice.   Psych: Alert and cooperative, normal mood and affect.  Labs:   See hpi   Imaging Studies: No results found.   Assessment:  IBS-D: Chronic diarrhea dating back to her teenage years.  Celiac serologies/duodenal biopsies negative for celiac disease.  Terminal ileum normal on recent colonoscopy.  Random colon biopsies negative.  Taking Imodium only sparingly.  Overall doing well.  GERD: Symptoms started within the past year.  Mostly nocturnal.  Associated with epigastric discomfort.  EGD with peptic duodenitis and H. pylori gastritis.  Small hiatal hernia noted.  She completed Prevpac.  Initially symptoms started to improve but feels like she has had some recurrence but not as bad.  Taking Nexium 20 mg daily as needed only, likely taking only if she wakes up with nocturnal symptoms.  H. pylori gastritis: Completed Prevpac.  Need to make sure H. pylori eradicated.  Abnormal LFTs: Mildly elevated transaminases.  Last liver imaging in 2015 unremarkable.  Hepatitis B and C serologies negative.  Iron/TIBC/iron saturations normal.  Ferritin in the 200s.  Autoimmune markers negative with exception of elevated IgG/IgM/IgA.   Plan: Due to family history of colon polyps (father) at age less than 21, subsequent colon cancer, she needs to have colonoscopy every 5 years. Patient is aware that if she has any change in bowel habits, anemia, unexplained weight loss, recurrent blood in stool, she should let us know and we would update colonoscopy as needed. She also has 2 paternal aunts also with colon cancer, 1 around  age 83.  Her son should have his first colonoscopy at age 34 but subsequent colonoscopies based on findings.  Her siblings should have their first colonoscopies at age 49 and minimal every 5 years thereafter.  H.pylori stool antigen.  She has not been on antibiotics or PPI for 2 weeks so she will go ahead and complete now.   After stool collection, she can continue Nexium 20 mg daily as needed. Repeat LFTs, immunoglobulins, ferritin.  Await findings.  Next step will likely be abdominal ultrasound.

## 2021-11-18 NOTE — Patient Instructions (Signed)
Please have labs and stool test done at [facility]. We will be in touch with results as available. Once we have results, we will discuss next step. Will likely offer you a liver ultrasound, last liver imaging has been greater than five years ago. Your son should have his first colonoscopy at age 58, then future colonoscopies based on the findings of his exam. Your siblings should have their first colonoscopy at age 5, and then at least every five years due to your father's history of colon polyps/colon cancer.

## 2021-11-19 LAB — IGG, IGA, IGM
IgA/Immunoglobulin A, Serum: 415 mg/dL — ABNORMAL HIGH (ref 87–352)
IgG (Immunoglobin G), Serum: 1690 mg/dL — ABNORMAL HIGH (ref 586–1602)
IgM (Immunoglobulin M), Srm: 264 mg/dL — ABNORMAL HIGH (ref 26–217)

## 2021-11-19 LAB — HEPATIC FUNCTION PANEL
ALT: 69 IU/L — ABNORMAL HIGH (ref 0–32)
AST: 37 IU/L (ref 0–40)
Albumin: 4.7 g/dL (ref 3.8–4.9)
Alkaline Phosphatase: 86 IU/L (ref 44–121)
Bilirubin Total: 0.4 mg/dL (ref 0.0–1.2)
Bilirubin, Direct: 0.11 mg/dL (ref 0.00–0.40)
Total Protein: 8.5 g/dL (ref 6.0–8.5)

## 2021-11-19 LAB — FERRITIN: Ferritin: 383 ng/mL — ABNORMAL HIGH (ref 15–150)

## 2021-11-26 ENCOUNTER — Other Ambulatory Visit: Payer: Self-pay

## 2021-11-26 DIAGNOSIS — R7989 Other specified abnormal findings of blood chemistry: Secondary | ICD-10-CM

## 2021-11-26 NOTE — Progress Notes (Unsigned)
u

## 2021-12-05 ENCOUNTER — Other Ambulatory Visit: Payer: Self-pay

## 2021-12-05 ENCOUNTER — Ambulatory Visit (HOSPITAL_COMMUNITY)
Admission: RE | Admit: 2021-12-05 | Discharge: 2021-12-05 | Disposition: A | Discharge status: Home / Self Care | Payer: 59 | Source: Ambulatory Visit | Attending: Gastroenterology | Admitting: Gastroenterology

## 2021-12-05 DIAGNOSIS — R7989 Other specified abnormal findings of blood chemistry: Secondary | ICD-10-CM | POA: Diagnosis not present

## 2021-12-07 LAB — H. PYLORI ANTIGEN, STOOL: H pylori Ag, Stl: NEGATIVE

## 2021-12-09 ENCOUNTER — Other Ambulatory Visit: Payer: Self-pay | Admitting: *Deleted

## 2021-12-09 ENCOUNTER — Encounter: Payer: Self-pay | Admitting: *Deleted

## 2021-12-09 DIAGNOSIS — R7989 Other specified abnormal findings of blood chemistry: Secondary | ICD-10-CM

## 2021-12-09 DIAGNOSIS — N281 Cyst of kidney, acquired: Secondary | ICD-10-CM

## 2021-12-09 DIAGNOSIS — K838 Other specified diseases of biliary tract: Secondary | ICD-10-CM

## 2021-12-17 ENCOUNTER — Encounter: Payer: Self-pay | Admitting: *Deleted

## 2021-12-20 ENCOUNTER — Other Ambulatory Visit: Payer: Self-pay

## 2021-12-20 ENCOUNTER — Ambulatory Visit (HOSPITAL_COMMUNITY)
Admission: RE | Admit: 2021-12-20 | Discharge: 2021-12-20 | Disposition: A | Discharge status: Home / Self Care | Payer: 59 | Source: Ambulatory Visit | Attending: Gastroenterology | Admitting: Gastroenterology

## 2021-12-20 ENCOUNTER — Other Ambulatory Visit: Payer: Self-pay | Admitting: Gastroenterology

## 2021-12-20 DIAGNOSIS — K838 Other specified diseases of biliary tract: Secondary | ICD-10-CM

## 2021-12-20 DIAGNOSIS — R7989 Other specified abnormal findings of blood chemistry: Secondary | ICD-10-CM

## 2021-12-20 MED ORDER — GADOBUTROL 1 MMOL/ML IV SOLN
10.0000 mL | Freq: Once | INTRAVENOUS | Status: AC | PRN
  Administered 2021-12-20: 10 mL via INTRAVENOUS

## 2021-12-30 ENCOUNTER — Ambulatory Visit: Payer: 59 | Admitting: Urology

## 2022-01-07 ENCOUNTER — Other Ambulatory Visit: Payer: Self-pay

## 2022-01-07 ENCOUNTER — Encounter: Payer: Self-pay | Admitting: Urology

## 2022-01-07 ENCOUNTER — Ambulatory Visit (INDEPENDENT_AMBULATORY_CARE_PROVIDER_SITE_OTHER): Payer: 59 | Admitting: Urology

## 2022-01-07 VITALS — BP 101/70 | HR 75

## 2022-01-07 DIAGNOSIS — N3946 Mixed incontinence: Secondary | ICD-10-CM | POA: Diagnosis not present

## 2022-01-07 DIAGNOSIS — R8290 Unspecified abnormal findings in urine: Secondary | ICD-10-CM

## 2022-01-07 DIAGNOSIS — N281 Cyst of kidney, acquired: Secondary | ICD-10-CM | POA: Diagnosis not present

## 2022-01-07 MED ORDER — MIRABEGRON ER 50 MG PO TB24: 50.0000 mg | ORAL_TABLET | Freq: Every day | ORAL | 0 refills | Status: DC

## 2022-01-07 NOTE — Progress Notes (Signed)
? ?Assessment: ?1. Renal cyst, left   ?2. Mixed incontinence   ? ? ?Plan: ?Urine culture sent today ?Trial of Myrbetriq 50 mg daily.  Samples given. ?I personally reviewed the patient's imaging studies including CT, ultrasound, and MRI dating back to 2015.  The left renal cyst appear benign and have slightly increased in size.  It appears that there are 2 cysts adjacent to each other.  I did not recommend any intervention at this time. ?Consider repeat imaging with ultrasound in 1 year. ?Return to office in 1 month. ? ?Chief Complaint:  ?Chief Complaint  ?Patient presents with  ? renal cyst  ? ? ?History of Present Illness: ? ?Samantha [name] is a 56 y.o. year old female who is seen for further evaluation of a left renal cyst and urinary incontinence .  Review of her chart indicates that a left renal cyst was identified on CT imaging in May 2015.  CT at that time showed 2 left renal sinus cyst measuring 5.1 x 5.9 and 6.7 x 7.6 cm in the left kidney.  Renal ultrasound from 12/16 demonstrated 2 large left renal cyst measuring 6.5 cm and 8.4 cm respectively.  No changes noted from prior CT study.  Recent abdominal ultrasound from 12/05/2021 showed a 14 x 9.9 x 11.7 cm benign-appearing cyst in the left kidney.  MRI from 12/22/2021 demonstrated a large left renal cyst measuring 11.7 x 11 cm with some thin enhancing internal septations without any suspicious enhancing soft tissue nodularity or wall thickening, consistent with a Bosniak 2 left renal cyst. ?She has a prior history of UTIs.  No recent UTI symptoms. ?She also has a history of mixed urinary incontinence.  She does have symptoms of frequency, urgency, stress and urge incontinence.  She has previously been evaluated by Dr. Ashley Royalty in [facility] and treated with pelvic floor physical therapy.  She was given a trial of Myrbetriq 25 mg daily at her visit in January 2022.  She noted some slight improvement in her symptoms.  She is not on any medical therapy at the  present time. ? ? ?Past Medical History:  ?Past Medical History:  ?Diagnosis Date  ? IBS (irritable bowel syndrome)   ? PONV (postoperative nausea and vomiting)   ? ? ?Past Surgical History:  ?Past Surgical History:  ?Procedure Laterality Date  ? APPENDECTOMY    ? BIOPSY  04/22/2021  ? Procedure: BIOPSY;  Surgeon: Lanelle Bal, DO;  [facility];  Service: Endoscopy;;  ? CHOLECYSTECTOMY  2000  ? [place]  ? COLONOSCOPY  2004  ? Dr. Jena Gauss: normal ileoscolonoscopy, internal hemorrhodis  ? COLONOSCOPY  2009  ? [id number].  ? COLONOSCOPY N/A 09/27/2015  ? [facility]: Normal ileo-colonoscopy staus post biopsy and stool sampling  ? COLONOSCOPY WITH PROPOFOL N/A 04/22/2021  ? Procedure: COLONOSCOPY WITH PROPOFOL;  Surgeon: Lanelle Bal, DO;  [facility];  Service: Endoscopy;  Laterality: N/A;  2:15pm ?needs Colonoscopy with possible ileocolonoscopy/Random colon biopsies  ? ESOPHAGOGASTRODUODENOSCOPY (EGD) WITH PROPOFOL N/A 04/22/2021  ? Procedure: ESOPHAGOGASTRODUODENOSCOPY (EGD) WITH PROPOFOL;  Surgeon: Lanelle Bal, DO;  [facility];  Service: Endoscopy;  Laterality: N/A;  ? POLYPECTOMY  04/22/2021  ? Procedure: POLYPECTOMY;  Surgeon: Lanelle Bal, DO;  [facility];  Service: Endoscopy;;  ? ? ?Allergies:  ?No Known Allergies ? ?Family History:  ?Family History  ?Problem Relation Age of Onset  ? Colon cancer Father   ?  [place], around age 11, colon polyps. diagosed with colon cancer arournd age 50  ? Colon cancer Paternal Aunt   ?     5s  ? Colon cancer Paternal Aunt   ?     82  ? Inflammatory bowel disease Neg Hx   ? Celiac disease Neg Hx   ? ? ?Social History:  ?Social History  ? ?Tobacco Use  ? Smoking status: Never  ? Smokeless tobacco: Never  ?Vaping Use  ? Vaping Use: Never used  ?Substance Use Topics  ? Alcohol use: No  ?  Alcohol/week: 0.0 standard drinks  ? Drug use: No  ? ? ?Review of symptoms:  ?Constitutional:  Negative for unexplained  weight loss, night sweats, fever, chills ?ENT:  Negative for nose bleeds, sinus pain, painful swallowing ?CV:  Negative for chest pain, shortness of breath, exercise intolerance, palpitations, loss of consciousness ?Resp:  Negative for cough, wheezing, shortness of breath ?GI:  Negative for nausea, vomiting, diarrhea, bloody stools ?GU:  Positives noted in HPI; otherwise negative for gross hematuria, dysuria ?Neuro:  Negative for seizures, poor balance, limb weakness, slurred speech ?Psych:  Negative for lack of energy, depression, anxiety ?Endocrine:  Negative for polydipsia, polyuria, symptoms of hypoglycemia (dizziness, hunger, sweating) ?Hematologic:  Negative for anemia, purpura, petechia, prolonged or excessive bleeding, use of anticoagulants  ?Allergic:  Negative for difficulty breathing or choking as a result of exposure to anything; no shellfish allergy; no allergic response (rash/itch) to materials, foods ? ?Physical exam: ?BP 101/70   Pulse 75   LMP 09/13/2015  ?GENERAL APPEARANCE:  Well appearing, well developed, well nourished, NAD ?HEENT: Atraumatic, Normocephalic, oropharynx clear. ?NECK: Supple without lymphadenopathy or thyromegaly. ?LUNGS: Clear to auscultation bilaterally. ?HEART: Regular Rate and Rhythm without murmurs, gallops, or rubs. ?ABDOMEN: Soft, non-tender, No Masses. ?EXTREMITIES: Moves all extremities well.  Without clubbing, cyanosis, or edema. ?NEUROLOGIC:  Alert and oriented x 3, normal gait, CN II-XII grossly intact.  ?MENTAL STATUS:  Appropriate. ?BACK:  Non-tender to palpation.  No CVAT ?SKIN:  Warm, dry and intact.   ? ?Results: ?U/A:  6-10 WBC, 3-10 RBC, >10 epithelial cells, many bacteria ? ?

## 2022-01-08 LAB — MICROSCOPIC EXAMINATION
Epithelial Cells (non renal): >10 /hpf — AB (ref 0–10)
Renal Epithel, UA: >10 /hpf — AB

## 2022-01-08 LAB — URINALYSIS, ROUTINE W REFLEX MICROSCOPIC
Bilirubin, UA: NEGATIVE
Glucose, UA: NEGATIVE
Ketones, UA: NEGATIVE
Nitrite, UA: NEGATIVE
Protein,UA: NEGATIVE
Specific Gravity, UA: 1.015 (ref 1.005–1.030)
Urobilinogen, Ur: 0.2 mg/dL (ref 0.2–1.0)
pH, UA: 5.5 (ref 5.0–7.5)

## 2022-01-09 LAB — URINE CULTURE

## 2022-01-27 ENCOUNTER — Other Ambulatory Visit: Payer: Self-pay

## 2022-01-27 DIAGNOSIS — R7989 Other specified abnormal findings of blood chemistry: Secondary | ICD-10-CM

## 2022-01-29 NOTE — Progress Notes (Signed)
Pt was made aware and verbalized understanding.  

## 2022-02-11 ENCOUNTER — Ambulatory Visit: Payer: 59 | Admitting: Urology

## 2022-02-11 NOTE — Progress Notes (Deleted)
Assessment: 1. Renal cyst, left   2. Mixed incontinence     Plan: Trial of Myrbetriq 50 mg daily.  Samples given.  Chief Complaint:  No chief complaint on file.   History of Present Illness:  Samantha [name] is a 56 y.o. year old female who is seen for further evaluation of a left renal cyst and urinary incontinence .  Review of her chart indicates that a left renal cyst was identified on CT imaging in May 2015.  CT at that time showed 2 left renal sinus cyst measuring 5.1 x 5.9 and 6.7 x 7.6 cm in the left kidney.  Renal ultrasound from 12/16 demonstrated 2 large left renal cyst measuring 6.5 cm and 8.4 cm respectively.  No changes noted from prior CT study.  Recent abdominal ultrasound from 12/05/2021 showed a 14 x 9.9 x 11.7 cm benign-appearing cyst in the left kidney.  MRI from 12/22/2021 demonstrated a large left renal cyst measuring 11.7 x 11 cm with some thin enhancing internal septations without any suspicious enhancing soft tissue nodularity or wall thickening, consistent with a Bosniak 2 left renal cyst. She has a prior history of UTIs.  No recent UTI symptoms. She also has a history of mixed urinary incontinence.  She does have symptoms of frequency, urgency, stress and urge incontinence.  She has previously been evaluated by Dr. Ashley Royalty in [facility] and treated with pelvic floor physical therapy.  She was given a trial of Myrbetriq 25 mg daily at her visit in January 2022.  She noted some slight improvement in her symptoms.  She is not on any medical therapy at the present time.  Urine culture from 3/23 grew 25-50 K mixed flora. She was given a trial of Myrbetriq 50 mg daily at her visit in March 2023.  Portions of the above documentation were copied from a prior visit for review purposes only.   Past Medical History:  Past Medical History:  Diagnosis Date   IBS (irritable bowel syndrome)    PONV (postoperative nausea and vomiting)     Past Surgical History:  Past  Surgical History:  Procedure Laterality Date   APPENDECTOMY     BIOPSY  04/22/2021   Procedure: BIOPSY;  Surgeon: Lanelle Bal, DO;  [facility];  Service: Endoscopy;;   CHOLECYSTECTOMY  2000   [place]   COLONOSCOPY  2004   Dr. Jena Gauss: normal ileoscolonoscopy, internal hemorrhodis   COLONOSCOPY  2009   [id number].   COLONOSCOPY N/A 09/27/2015   RMR: Normal ileo-colonoscopy staus post biopsy and stool sampling   COLONOSCOPY WITH PROPOFOL N/A 04/22/2021   Procedure: COLONOSCOPY WITH PROPOFOL;  Surgeon: Lanelle Bal, DO;  [facility];  Service: Endoscopy;  Laterality: N/A;  2:15pm needs Colonoscopy with possible ileocolonoscopy/Random colon biopsies   ESOPHAGOGASTRODUODENOSCOPY (EGD) WITH PROPOFOL N/A 04/22/2021   Procedure: ESOPHAGOGASTRODUODENOSCOPY (EGD) WITH PROPOFOL;  Surgeon: Lanelle Bal, DO;  [facility];  Service: Endoscopy;  Laterality: N/A;   POLYPECTOMY  04/22/2021   Procedure: POLYPECTOMY;  Surgeon: Lanelle Bal, DO;  [facility];  Service: Endoscopy;;    Allergies:  No Known Allergies  Family History:  Family History  Problem Relation Age of Onset   Colon cancer Father        [place], around age 60, colon polyps. diagosed with colon cancer arournd age 67   Colon cancer Paternal Aunt        73s   Colon  cancer Paternal Aunt        15   Inflammatory bowel disease Neg Hx    Celiac disease Neg Hx     Social History:  Social History   Tobacco Use   Smoking status: Never   Smokeless tobacco: Never  Vaping Use   Vaping Use: Never used  Substance Use Topics   Alcohol use: No    Alcohol/week: 0.0 standard drinks   Drug use: No    ROS: Constitutional:  Negative for fever, chills, weight loss CV: Negative for chest pain, previous MI, hypertension Respiratory:  Negative for shortness of breath, wheezing, sleep apnea, frequent cough GI:  Negative for nausea, vomiting, bloody stool,  GERD  Physical exam: LMP 09/13/2015  GENERAL APPEARANCE:  Well appearing, well developed, well nourished, NAD HEENT:  Atraumatic, normocephalic, oropharynx clear NECK:  Supple without lymphadenopathy or thyromegaly ABDOMEN:  Soft, non-tender, no masses EXTREMITIES:  Moves all extremities well, without clubbing, cyanosis, or edema NEUROLOGIC:  Alert and oriented x 3, normal gait, CN II-XII grossly intact MENTAL STATUS:  appropriate BACK:  Non-tender to palpation, No CVAT SKIN:  Warm, dry, and intact  Results: U/A:

## 2022-04-10 ENCOUNTER — Ambulatory Visit (HOSPITAL_COMMUNITY)
Admission: RE | Admit: 2022-04-10 | Discharge: 2022-04-10 | Disposition: A | Discharge status: Home / Self Care | Payer: 59 | Source: Ambulatory Visit | Attending: Internal Medicine | Admitting: Internal Medicine

## 2022-04-10 ENCOUNTER — Other Ambulatory Visit (HOSPITAL_COMMUNITY): Payer: Self-pay | Admitting: Internal Medicine

## 2022-04-10 DIAGNOSIS — M6520 Calcific tendinitis, unspecified site: Secondary | ICD-10-CM

## 2022-04-15 LAB — HEPATIC FUNCTION PANEL
ALT: 58 IU/L — ABNORMAL HIGH (ref 0–32)
AST: 28 IU/L (ref 0–40)
Albumin: 4.4 g/dL (ref 3.8–4.9)
Alkaline Phosphatase: 70 IU/L (ref 44–121)
Bilirubin Total: 0.3 mg/dL (ref 0.0–1.2)
Bilirubin, Direct: <0.1 mg/dL (ref 0.00–0.40)
Total Protein: 7.4 g/dL (ref 6.0–8.5)

## 2022-04-15 LAB — IGG, IGA, IGM
IgA/Immunoglobulin A, Serum: 429 mg/dL — ABNORMAL HIGH (ref 87–352)
IgG (Immunoglobin G), Serum: 1538 mg/dL (ref 586–1602)
IgM (Immunoglobulin M), Srm: 253 mg/dL — ABNORMAL HIGH (ref 26–217)

## 2022-04-15 LAB — FERRITIN: Ferritin: 315 ng/mL — ABNORMAL HIGH (ref 15–150)

## 2022-04-15 LAB — ANTI-MICROSOMAL ANTIBODY LIVER / KIDNEY: LKM1 Ab: 0.6 Units (ref 0.0–20.0)

## 2022-04-15 LAB — ANTINUCLEAR ANTIBODIES, IFA: ANA Titer 1: NEGATIVE

## 2022-05-27 ENCOUNTER — Encounter: Payer: Self-pay | Admitting: Gastroenterology

## 2022-05-27 ENCOUNTER — Ambulatory Visit (INDEPENDENT_AMBULATORY_CARE_PROVIDER_SITE_OTHER): Payer: 59 | Admitting: Gastroenterology

## 2022-05-27 VITALS — BP 127/76 | HR 64 | Temp 97.3°F | Ht 60.0 in | Wt 192.0 lb

## 2022-05-27 DIAGNOSIS — K529 Noninfective gastroenteritis and colitis, unspecified: Secondary | ICD-10-CM | POA: Diagnosis not present

## 2022-05-27 DIAGNOSIS — R7989 Other specified abnormal findings of blood chemistry: Secondary | ICD-10-CM

## 2022-05-27 DIAGNOSIS — K219 Gastro-esophageal reflux disease without esophagitis: Secondary | ICD-10-CM | POA: Diagnosis not present

## 2022-05-27 NOTE — Patient Instructions (Signed)
Please collect stool to rule out infection. I will discuss your case with our liver colleagues from [place]. Further recommendations to follow.  Limit foods from the "foods to avoid" list below. These can increase gas and bloating.  Low-FODMAP Eating Plan  FODMAP stands for fermentable oligosaccharides, disaccharides, monosaccharides, and polyols. These are sugars that are hard for some people to digest. A low-FODMAP eating plan may help some people who have irritable bowel syndrome (IBS) and certain other bowel (intestinal) diseases to manage their symptoms. This meal plan can be complicated to follow. Work with a diet and nutrition specialist (dietitian) to make a low-FODMAP eating plan that is right for you. A dietitian can help make sure that you get enough nutrition from this diet. What are tips for following this plan? Reading food labels Check labels for hidden FODMAPs such as: High-fructose syrup. Honey. Agave. Natural fruit flavors. Onion or garlic powder. Choose low-FODMAP foods that contain 3-4 grams of fiber per serving. Check food labels for serving sizes. Eat only one serving at a time to make sure FODMAP levels stay low. Shopping Shop with a list of foods that are recommended on this diet and make a meal plan. Meal planning Follow a low-FODMAP eating plan for up to 6 weeks, or as told by your health care provider or dietitian. To follow the eating plan: Eliminate high-FODMAP foods from your diet completely. Choose only low-FODMAP foods to eat. You will do this for 2-6 weeks. Gradually reintroduce high-FODMAP foods into your diet one at a time. Most people should wait a few days before introducing the next new high-FODMAP food into their meal plan. Your dietitian can recommend how quickly you may reintroduce foods. Keep a daily record of what and how much you eat and drink. Make note of any symptoms that you have after eating. Review your daily record with a dietitian  regularly to identify which foods you can eat and which foods you should avoid. General tips Drink enough fluid each day to keep your urine pale yellow. Avoid processed foods. These often have added sugar and may be high in FODMAPs. Avoid most dairy products, whole grains, and sweeteners. Work with a dietitian to make sure you get enough fiber in your diet. Avoid high FODMAP foods at meals to manage symptoms. Recommended foods Fruits Bananas, oranges, tangerines, lemons, limes, blueberries, raspberries, strawberries, grapes, cantaloupe, honeydew melon, kiwi, papaya, passion fruit, and pineapple. Limited amounts of dried cranberries, banana chips, and shredded coconut. Vegetables Eggplant, zucchini, cucumber, peppers, green beans, bean sprouts, lettuce, arugula, kale, Swiss chard, spinach, collard greens, bok choy, summer squash, potato, and tomato. Limited amounts of corn, carrot, and sweet potato. Green parts of scallions. Grains Gluten-free grains, such as rice, oats, buckwheat, quinoa, corn, polenta, and millet. Gluten-free pasta, bread, or cereal. Rice noodles. Corn tortillas. Meats and other proteins Unseasoned beef, pork, poultry, or fish. Eggs. Tomasa Blase. Tofu (firm) and tempeh. Limited amounts of nuts and seeds, such as almonds, walnuts, [place] nuts, pecans, peanuts, nut butters, pumpkin seeds, chia seeds, and sunflower seeds. Dairy Lactose-free milk, yogurt, and kefir. Lactose-free cottage cheese and ice cream. Non-dairy milks, such as almond, coconut, hemp, and rice milk. Non-dairy yogurt. Limited amounts of goat cheese, brie, mozzarella, parmesan, swiss, and other hard cheeses. Fats and oils Butter-free spreads. Vegetable oils, such as olive, canola, and sunflower oil. Seasoning and other foods Artificial sweeteners with names that do not end in "ol," such as aspartame, saccharine, and stevia. Maple syrup, white table sugar, raw sugar,  brown sugar, and molasses. Mayonnaise, soy sauce, and  tamari. Fresh basil, coriander, parsley, rosemary, and thyme. Beverages Water and mineral water. Sugar-sweetened soft drinks. Small amounts of orange juice or cranberry juice. Black and green tea. Most dry wines. Coffee. The items listed above may not be a complete list of foods and beverages you can eat. Contact a dietitian for more information. Foods to avoid Fruits Fresh, dried, and juiced forms of apple, pear, watermelon, peach, plum, cherries, apricots, blackberries, boysenberries, figs, nectarines, and mango. Avocado. Vegetables Chicory root, artichoke, asparagus, cabbage, snow peas, Brussels sprouts, broccoli, sugar snap peas, mushrooms, celery, and cauliflower. Onions, garlic, leeks, and the white part of scallions. Grains Wheat, including kamut, durum, and semolina. Barley and bulgur. Couscous. Wheat-based cereals. Wheat noodles, bread, crackers, and pastries. Meats and other proteins Fried or fatty meat. Sausage. Cashews and pistachios. Soybeans, baked beans, black beans, chickpeas, kidney beans, fava beans, navy beans, lentils, black-eyed peas, and split peas. Dairy Milk, yogurt, ice cream, and soft cheese. Cream and sour cream. Milk-based sauces. Custard. Buttermilk. Soy milk. Seasoning and other foods Any sugar-free gum or candy. Foods that contain artificial sweeteners such as sorbitol, mannitol, isomalt, or xylitol. Foods that contain honey, high-fructose corn syrup, or agave. Bouillon, vegetable stock, beef stock, and chicken stock. Garlic and onion powder. Condiments made with onion, such as hummus, chutney, pickles, relish, salad dressing, and salsa. Tomato paste. Beverages Chicory-based drinks. Coffee substitutes. Chamomile tea. Fennel tea. Sweet or fortified wines such as port or sherry. Diet soft drinks made with isomalt, mannitol, maltitol, sorbitol, or xylitol. Apple, pear, and mango juice. Juices with high-fructose corn syrup. The items listed above may not be a complete  list of foods and beverages you should avoid. Contact a dietitian for more information. Summary FODMAP stands for fermentable oligosaccharides, disaccharides, monosaccharides, and polyols. These are sugars that are hard for some people to digest. A low-FODMAP eating plan is a short-term diet that helps to ease symptoms of certain bowel diseases. The eating plan usually lasts up to 6 weeks. After that, high-FODMAP foods are reintroduced gradually and one at a time. This can help you find out which foods may be causing symptoms. A low-FODMAP eating plan can be complicated. It is best to work with a dietitian who has experience with this type of plan. This information is not intended to replace advice given to you by your health care provider. Make sure you discuss any questions you have with your health care provider. Document Revised: 03/01/2020 Document Reviewed: 03/01/2020 Elsevier Patient Education  2023 [employer].

## 2022-05-27 NOTE — Progress Notes (Signed)
GI Office Note    Referring Provider: Assunta Found, MD Primary Care Physician:  Assunta Found, MD  Primary Gastroenterologist: Roetta Sessions, MD   Chief Complaint   Chief Complaint  Patient presents with   Bloated    Issues with diarrhea and bloating    History of Present Illness   Samantha [name] is a 56 y.o. female presenting today for follow-up.  Last seen in the office back in January.  She has a history of IBS with diarrhea, GERD.  History of H. pylori status post Prevpac.  Patient complains of four days of diarrhea. She has taken some imodium and Pepto which has slowed it down. Associated chills. She is concerned about change in her job potentially. 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 She is worried because with her IBS, she needs to have easy access to the bathroom. She typically has the most stools in the morning but often after lunch as well. 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 Because of her elevated liver numbers she is very hesitant to use medication regularly. Sometimes will take imodium 1-2 to slow down stools to function. Some bloating/gas. Occasion nocturnal reflux, regurgitation of acid/foam.  Wt Readings from Last 3 Encounters:  05/27/22 192 lb (87.1 kg)  11/18/21 185 lb 6.4 oz (84.1 kg)  03/27/21 179 lb (81.2 kg)  10/2020: 169  EGD June 2022: - Small hiatal hernia. - Gastritis. Biopsied.  Chronic active gastritis, H. pylori present. - Normal duodenal bulb, first portion of the duodenum and second portion of the duodenum. Biopsied.  Peptic duodenitis.   Colonoscopy June 2022: - Non-bleeding internal hemorrhoids. - Diverticulosis in the sigmoid colon. - One 2 mm polyp in the ascending colon, removed with a cold biopsy forceps. Resected and retrieved.  Polypoid fragment of benign colonic mucosa with underlying lymphoid aggregates with features suggesting lymphoproliferative  process. - The examined portion of the ileum was normal. - Biopsies were taken with a cold forceps from the ascending colon, transverse colon and descending colon for evaluation of microscopic colitis.  Benign colonic mucosa.   Medications   Current Outpatient Medications  Medication Sig Dispense Refill   esomeprazole (NEXIUM) 20 MG capsule Take 20 mg by mouth daily as needed (acid reflux).     loperamide (IMODIUM) 2 MG capsule Take 2 mg by mouth daily as needed for diarrhea or loose stools.     No current facility-administered medications for this visit.    Allergies   Allergies as of 05/27/2022   (No Known Allergies)     Review of Systems   General: Negative for anorexia, weight loss, fever, chills, fatigue, weakness. ENT: Negative for hoarseness, difficulty swallowing , nasal congestion. CV: Negative for chest pain, angina, palpitations, dyspnea on exertion, peripheral edema.  Respiratory: Negative for dyspnea at rest, dyspnea on exertion, cough, sputum, wheezing.  GI: See history of present illness. GU:  Negative for dysuria, hematuria, urinary incontinence, urinary frequency, nocturnal urination.  Endo: Negative for unusual weight change.     Physical Exam   BP 127/76 (BP Location: Right Arm, Patient Position: Sitting, Cuff Size: Normal)   Pulse 64   Temp (!) 97.3 F (36.3 C) (Temporal)   Ht 5' (1.524 m)   Wt 192 lb (87.1 kg)   LMP 09/12/2015 (Within Days) Comment: sparatic  SpO2 96%   BMI 37.50 kg/m    General: Well-nourished,  well-developed in no acute distress.  Eyes: No icterus. Mouth: Oropharyngeal mucosa moist and pink , no lesions erythema or exudate. Lungs: Clear to auscultation bilaterally.  Heart: Regular rate and rhythm, no murmurs rubs or gallops.  Abdomen: Bowel sounds are normal, nontender, nondistended, no hepatosplenomegaly or masses,  no abdominal bruits or hernia , no rebound or guarding.  Rectal: not performed Extremities: No lower  extremity edema. No clubbing or deformities. Neuro: Alert and oriented x 4   Skin: Warm and dry, no jaundice.   Psych: Alert and cooperative, normal mood and affect.  Labs   Lab Results  Component Value Date   FERRITIN 315 (H) 04/10/2022     Lab Results  Component Value Date   ALT 58 (H) 04/10/2022   AST 28 04/10/2022   ALKPHOS 70 04/10/2022   BILITOT 0.3 04/10/2022    Imaging Studies   No results found.  Assessment   IBS-D: continues to have frequent stools were in AM and after lunch. She has been able to tolerate as she works from home. 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 She is reluctant to take daily medications due to chronically elevated LFTs. Recent acute on chronic diarrhea, recommend stool studies.   GERD: overall doing ok. Avoid food triggers. Use nexium 20mg  daily as needed.  Abnormal LFTS: mildly elevated transaminases. Work up thus far negative except for ferritin that has been historically elevated and elevated immunoglobulins as outlined. Liver U/S (11/2021)with hepatic steatosis. Dilated CBD to 42mm post cholecystectomy. MRCP (11/2021) prominent CBD without stones or apparent stricture/mass etc.    PLAN   Collect stool to rule out infection. Limit high fodmap foods to see if this helps with gas/bloating. To discuss her case with 11-23-2004 at [facility]. Further recommendations to follow.  With future labs, we will check hemochromatosis genetics, TSH, free T4 as well. Recommend avoid food triggers for IBS.   [facility]. Leanna Battles, MHS, PA-C [facility]

## 2022-05-28 ENCOUNTER — Telehealth: Payer: Self-pay

## 2022-05-28 NOTE — Telephone Encounter (Signed)
Pt called requesting a note requesting that her work place to allow her to continue to work from home due to her condition.

## 2022-06-04 IMAGING — MR MR 3D RECON AT [facility]
19 of 22 series · 43 of 48 positions shown · IV contrast (gadavist)
Comparison: Ultrasound December 05, 2021 and CT March 23, 2014

CLINICAL DATA: Dilated common bile duct, abnormal LFTs.

EXAM:
MRI ABDOMEN WITHOUT AND WITH CONTRAST (INCLUDING MRCP)
TECHNIQUE: Multiplanar multisequence MR imaging of the abdomen was performed
both before and after the administration of intravenous contrast.
Heavily T2-weighted images of the biliary and pancreatic ducts were
obtained, and three-dimensional MRCP images were rendered by post
processing.
CONTRAST:  10mL GADAVIST GADOBUTROL 1 MMOL/ML IV SOLN

[Series 4: cor haste · coronal · 6.0mm · 1.41mm/px · 2 of 34 slices shown]
[im 1/34]
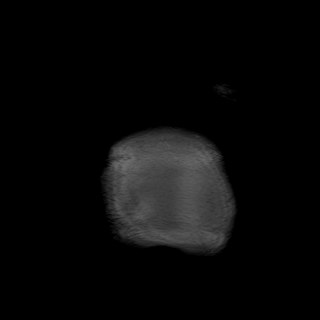
[im 34/34]
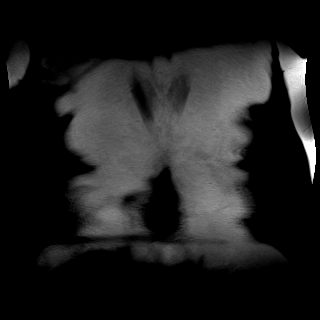

[Series 5: ax haste · axial · 6.0mm · 1.41mm/px · 1 of 40 slices shown]
[im 1/40]
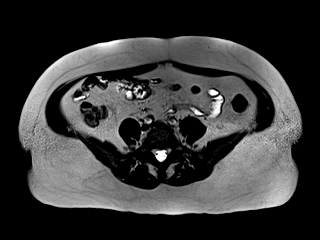

[Series 6: T2 fat-sat · axial · 6.0mm · 1.38mm/px · 1 of 40 slices shown]
[im 1/40]
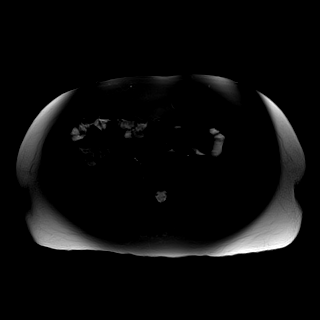

[Series 9: ax in and · axial · 3.0mm · 1.38mm/px · z∈[-77,+207]mm · 3 of 96 slices shown (1 of 2)]
[im 1/96]
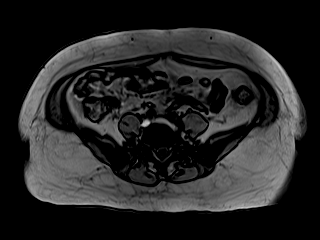
[im 48/96]
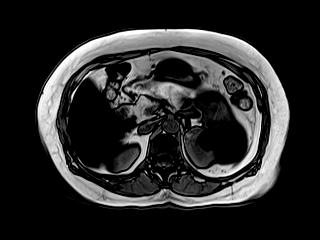
[im 96/96]
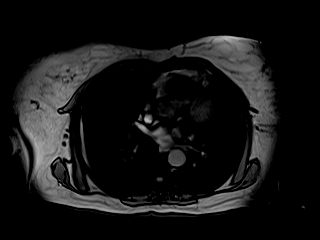

[Series 10: ax in and · axial · 3.0mm · 1.38mm/px · z∈[-77,+207]mm · 3 of 96 slices shown (2 of 2)]
[im 1/96]
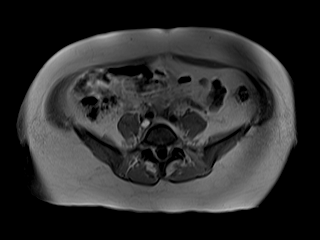
[im 48/96]
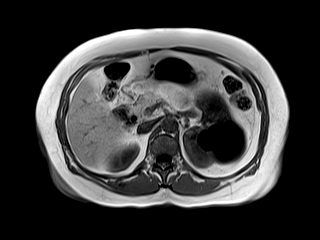
[im 96/96]
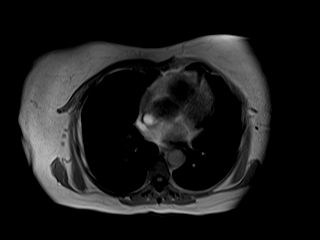

[Series 11: DWI · axial · 6.0mm · 1.64mm/px · 1 of 40 slices shown (1 of 4)]
[im 1/40]
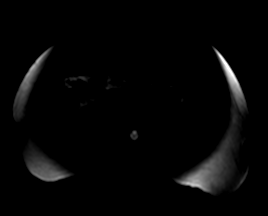

[Series 11: DWI · axial · 6.0mm · 1.64mm/px · 1 of 40 slices shown (2 of 4)]
[im 1/40]
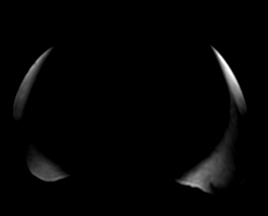

[Series 11: DWI · axial · 6.0mm · 1.64mm/px · 1 of 40 slices shown (3 of 4)]
[im 1/40]
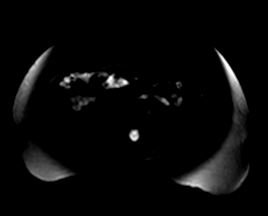

[Series 12: DWI · axial · 6.0mm · 1.64mm/px · 1 of 40 slices shown (4 of 4)]
[im 1/40]
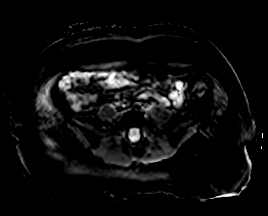

[Series 13: T1 dynamic · axial · non-contrast · 3.0mm · 1.38mm/px · z∈[-77,+207]mm · 3 of 96 slices shown (1 of 4)]
[im 1/96]
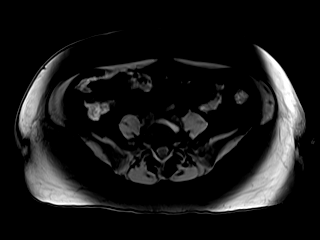
[im 48/96]
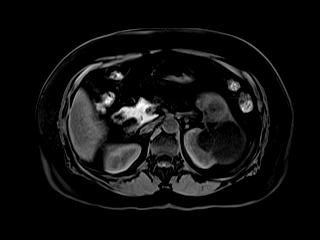
[im 96/96]
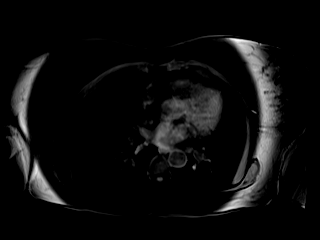

[Series 15: T1 dynamic post-contrast · axial · 3.0mm · 1.38mm/px · z∈[-77,+207]mm · 3 of 96 slices shown (1 of 6)]
[im 1/96]
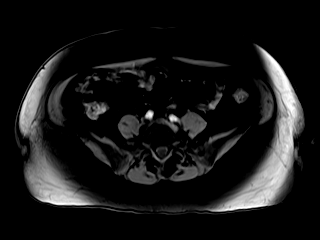
[im 48/96]
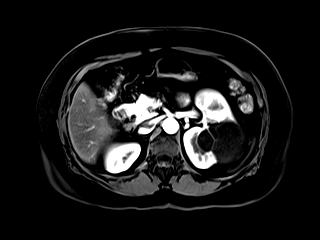
[im 96/96]
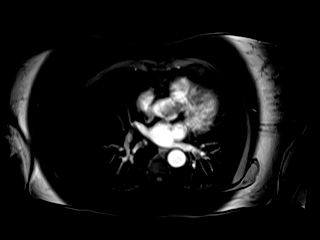

[Series 16: T1 dynamic · axial · 3.0mm · 1.38mm/px · z∈[-77,+207]mm · 3 of 96 slices shown (2 of 4)]
[im 1/96]
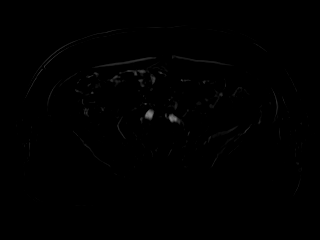
[im 48/96]
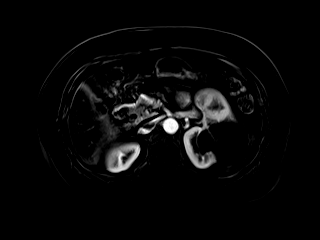
[im 96/96]
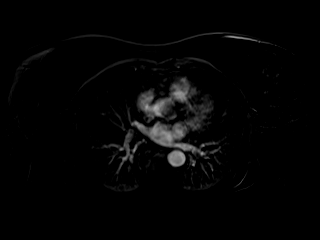

[Series 17: T1 dynamic post-contrast · axial · 3.0mm · 1.38mm/px · z∈[-77,+207]mm · 3 of 96 slices shown (2 of 6)]
[im 1/96]
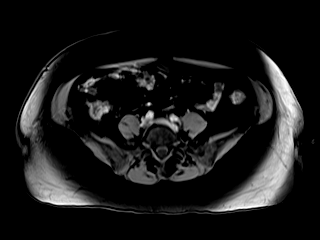
[im 48/96]
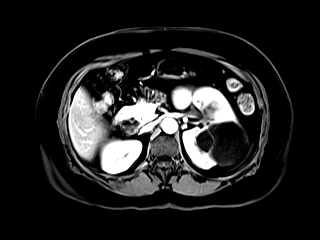
[im 96/96]
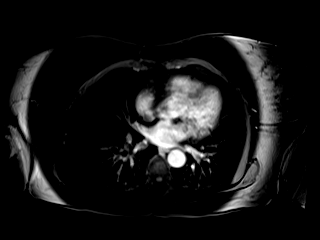

[Series 18: T1 dynamic · axial · 3.0mm · 1.38mm/px · z∈[-77,+207]mm · 3 of 96 slices shown (3 of 4)]
[im 1/96]
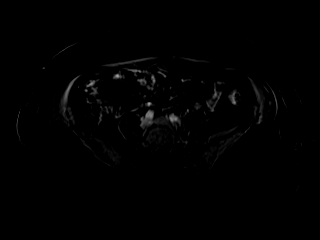
[im 48/96]
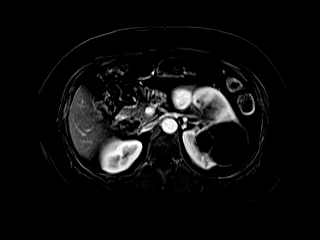
[im 96/96]
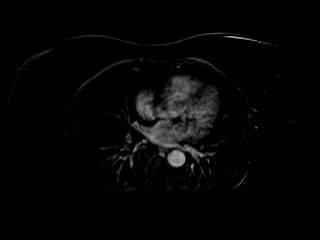

[Series 19: T1 dynamic post-contrast · axial · 3.0mm · 1.38mm/px · z∈[-77,+207]mm · 3 of 96 slices shown (3 of 6)]
[im 1/96]
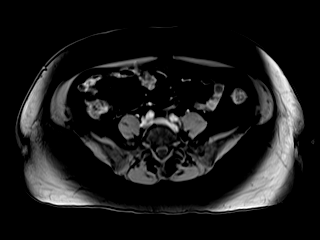
[im 48/96]
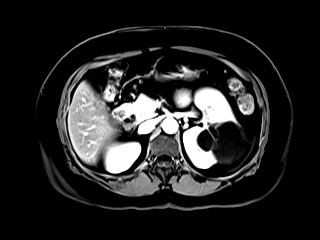
[im 96/96]
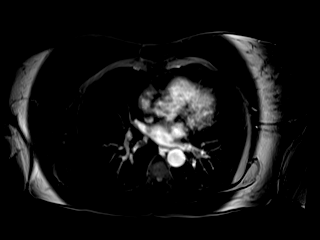

[Series 20: T1 dynamic · axial · 3.0mm · 1.38mm/px · z∈[-77,+207]mm · 3 of 96 slices shown (4 of 4)]
[im 1/96]
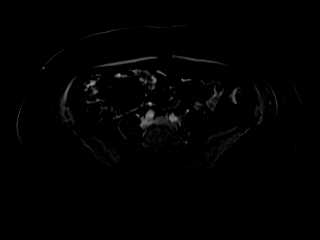
[im 48/96]
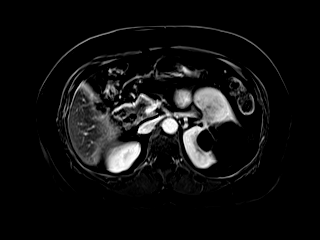
[im 96/96]
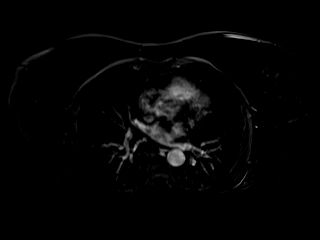

[Series 21: T1 dynamic post-contrast · coronal · 3.0mm · 1.31mm/px · 2 of 72 slices shown (4 of 6)]
[im 1/72]
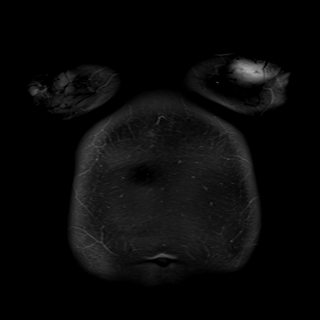
[im 72/72]
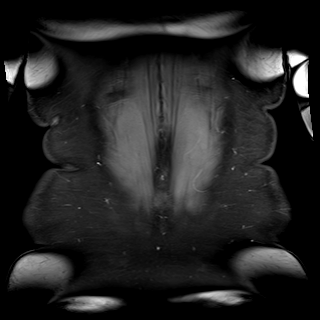

[Series 22: T1 dynamic post-contrast · axial · 3.0mm · 1.38mm/px · z∈[-77,+207]mm · 3 of 96 slices shown (5 of 6)]
[im 1/96]
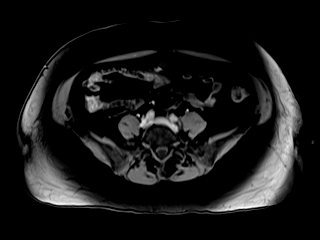
[im 48/96]
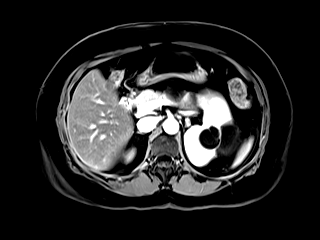
[im 96/96]
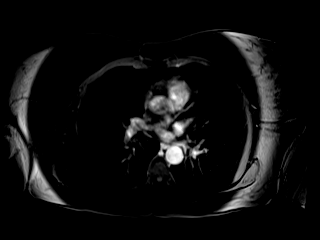

[Series 23: T1 dynamic post-contrast · axial · 3.0mm · 1.38mm/px · z∈[-77,+207]mm · 3 of 96 slices shown (6 of 6)]
[im 1/96]
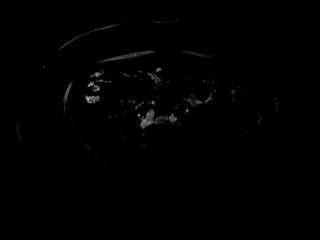
[im 48/96]
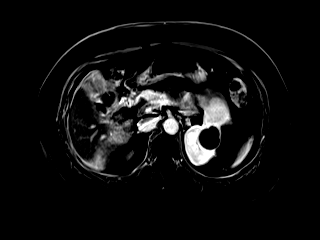
[im 96/96]
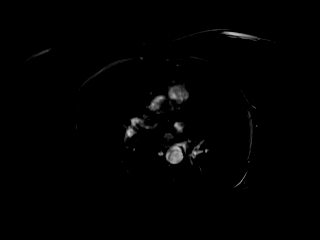

[43 of 48 positions shown; findings below may reference images not displayed]

FINDINGS: Lower chest: No acute abnormality.

Hepatobiliary: Severe diffuse hepatic steatosis with geographic
areas of decreased fatty infiltration including along the hepatic
hilum, gallbladder fossa and falciform ligament. No suspicious
hepatic lesion. Gallbladder surgically absent. Prominence of the
biliary tree with the common bile duct measuring 10 mm, but with
gentle tapering of the distal duct as it extends through the head of
the pancreas to the level of the ampulla. No choledocholithiasis or
suspicious lesions identified.

Pancreas: Intrinsic T1 signal of the pancreatic parenchyma is within
normal limits. Homogeneous postcontrast enhancement of the
pancreatic parenchyma. No pancreatic ductal dilation. No suspicious
pancreatic lesion.

Spleen:  No splenomegaly or suspicious splenic lesion.

Adrenals/Urinary Tract: Bilateral adrenal glands appear normal. No
hydronephrosis. Large left renal cysts measuring 11.7 x 11 cm on
image [DATE] which demonstrates some thin enhancing internal
septations without suspicious enhancing soft tissue nodularity or
wall/septal thickening.

Stomach/Bowel: Periampullary duodenal diverticulum. No pathologic
dilation or evidence of acute inflammation involving loops of large
or small bowel in the abdomen.

Vascular/Lymphatic: Normal caliber abdominal aorta. The portal,
splenic and superior mesenteric veins are patent.

Other:  No significant abdominopelvic free fluid.

Musculoskeletal: No suspicious bone lesions identified.
IMPRESSION: 1. Prominence of the biliary tree with dilation of the common bile
duct but gentle tapering of the distal duct as it extends through
the head of the pancreas to the level of the ampulla, and no
choledocholithiasis, stricture or biliary lesion/extrinsic
compression identified. Findings consistent with reservoir effect
post cholecystectomy.
2. Severe diffuse hepatic steatosis with geographic areas of
decreased fatty infiltration including along the hepatic hilum,
gallbladder fossa and falciform ligament.
3. Benign 11.7 cm Bosniak classification 2 left renal cyst.

## 2022-06-04 IMAGING — MR MR ABDOMEN WO/W CM MRCP
20 of 21 series · 45 of 48 positions shown · IV contrast (gadavist)
Comparison: Ultrasound December 05, 2021 and CT March 23, 2014

CLINICAL DATA: Dilated common bile duct, abnormal LFTs.

EXAM:
MRI ABDOMEN WITHOUT AND WITH CONTRAST (INCLUDING MRCP)
TECHNIQUE: Multiplanar multisequence MR imaging of the abdomen was performed
both before and after the administration of intravenous contrast.
Heavily T2-weighted images of the biliary and pancreatic ducts were
obtained, and three-dimensional MRCP images were rendered by post
processing.
CONTRAST:  10mL GADAVIST GADOBUTROL 1 MMOL/ML IV SOLN

[Series 4: cor haste · coronal · 6.0mm · 1.41mm/px · 2 of 34 slices shown]
[im 1/34]
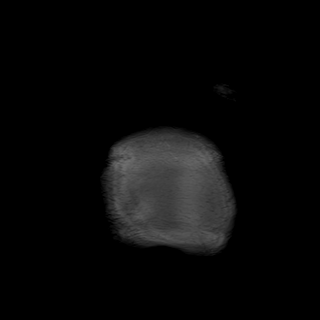
[im 34/34]
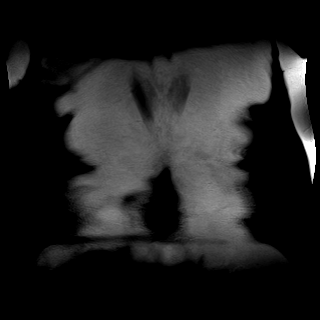

[Series 5: ax haste · axial · 6.0mm · 1.41mm/px · z∈[-74,+206]mm · 2 of 40 slices shown]
[im 1/40]
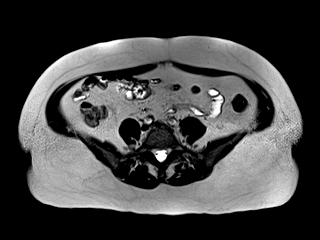
[im 40/40]
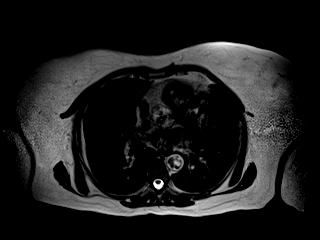

[Series 7: T2 fat-sat · axial · 6.0mm · 1.38mm/px · 1 of 40 slices shown]
[im 1/40]
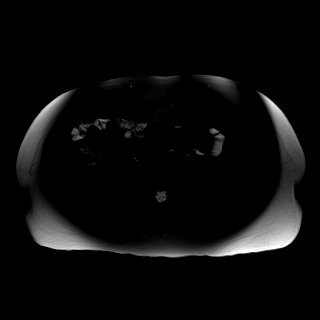

[Series 11: ax in and · axial · 3.0mm · 1.38mm/px · z∈[-77,+207]mm · 3 of 96 slices shown (1 of 2)]
[im 1/96]
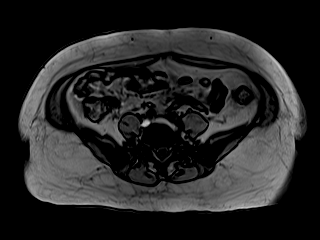
[im 48/96]
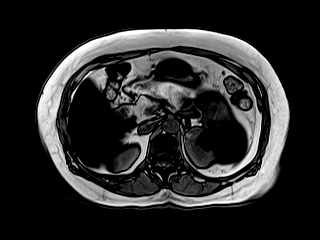
[im 96/96]
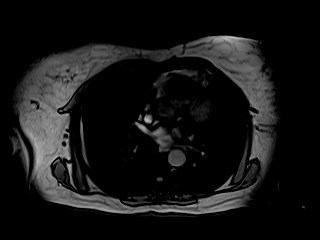

[Series 12: ax in and · axial · 3.0mm · 1.38mm/px · z∈[-77,+207]mm · 3 of 96 slices shown (2 of 2)]
[im 1/96]
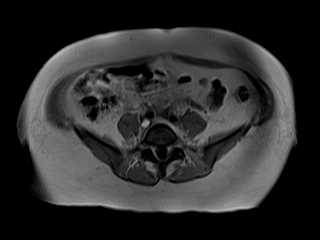
[im 48/96]
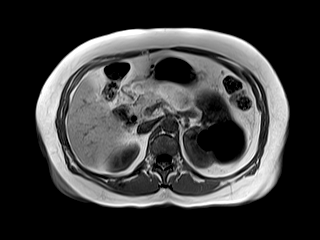
[im 96/96]
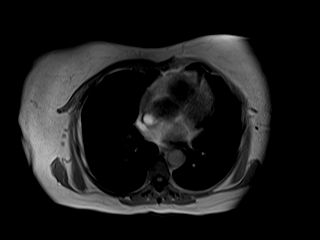

[Series 13: DWI · axial · 6.0mm · 1.64mm/px · 1 of 40 slices shown (1 of 4)]
[im 1/40]
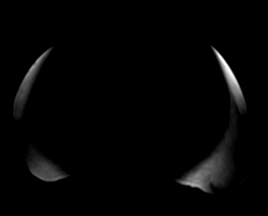

[Series 13: DWI · axial · 6.0mm · 1.64mm/px · 1 of 40 slices shown (2 of 4)]
[im 1/40]
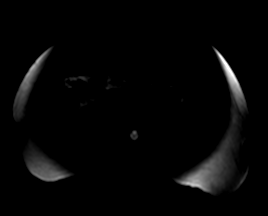

[Series 13: DWI · axial · 6.0mm · 1.64mm/px · 1 of 40 slices shown (3 of 4)]
[im 1/40]
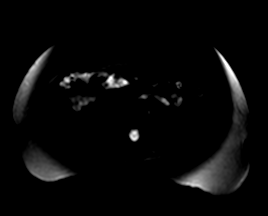

[Series 14: DWI · axial · 6.0mm · 1.64mm/px · 1 of 40 slices shown (4 of 4)]
[im 1/40]
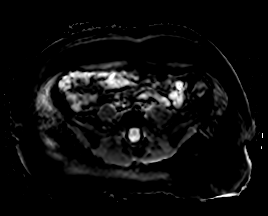

[Series 15: T1 dynamic · axial · non-contrast · 3.0mm · 1.38mm/px · z∈[-77,+207]mm · 3 of 96 slices shown (1 of 4)]
[im 1/96]
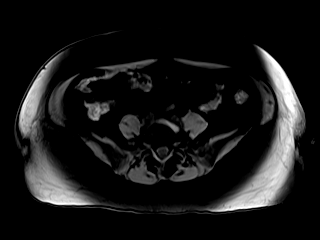
[im 48/96]
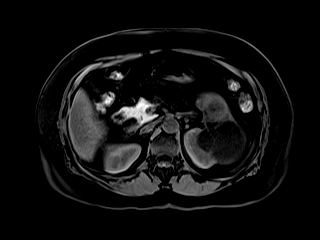
[im 96/96]
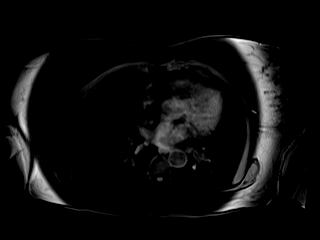

[Series 17: T1 dynamic post-contrast · axial · 3.0mm · 1.38mm/px · z∈[-77,+207]mm · 3 of 96 slices shown (1 of 6)]
[im 1/96]
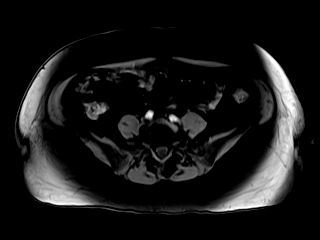
[im 48/96]
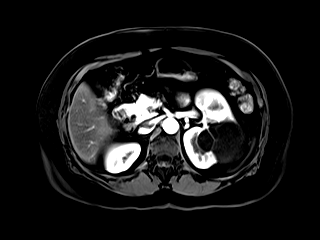
[im 96/96]
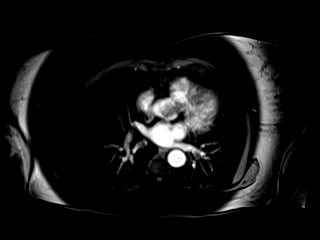

[Series 18: T1 dynamic · axial · 3.0mm · 1.38mm/px · z∈[-77,+207]mm · 3 of 96 slices shown (2 of 4)]
[im 1/96]
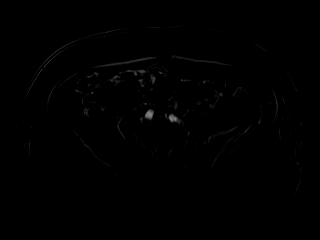
[im 48/96]
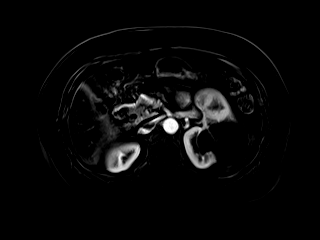
[im 96/96]
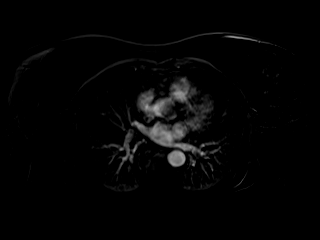

[Series 19: T1 dynamic post-contrast · axial · 3.0mm · 1.38mm/px · z∈[-77,+207]mm · 3 of 96 slices shown (2 of 6)]
[im 1/96]
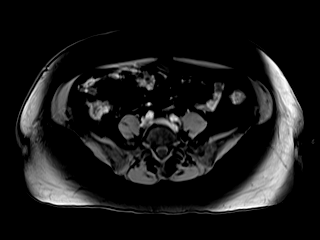
[im 48/96]
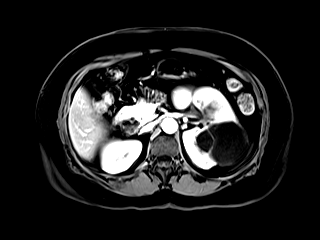
[im 96/96]
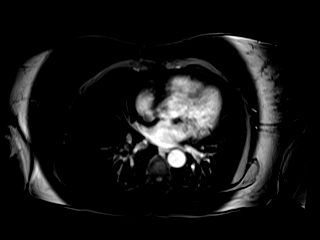

[Series 20: T1 dynamic · axial · 3.0mm · 1.38mm/px · z∈[-77,+207]mm · 3 of 96 slices shown (3 of 4)]
[im 1/96]
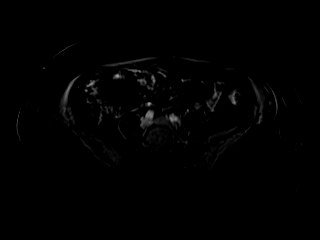
[im 48/96]
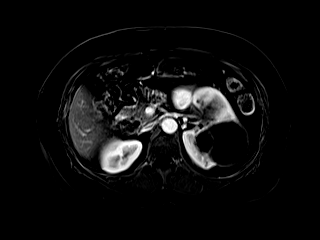
[im 96/96]
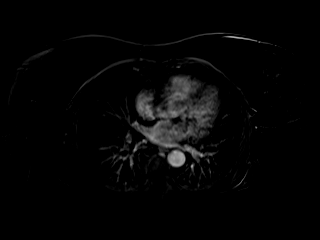

[Series 21: T1 dynamic post-contrast · axial · 3.0mm · 1.38mm/px · z∈[-77,+207]mm · 3 of 96 slices shown (3 of 6)]
[im 1/96]
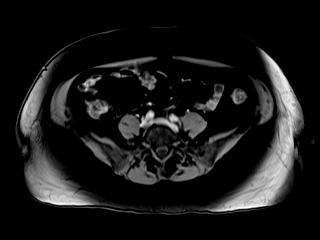
[im 48/96]
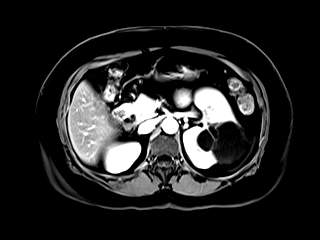
[im 96/96]
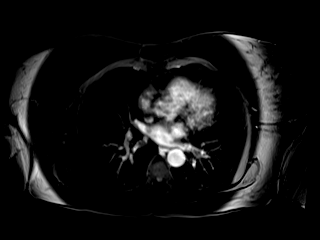

[Series 22: T1 dynamic · axial · 3.0mm · 1.38mm/px · z∈[-77,+207]mm · 3 of 96 slices shown (4 of 4)]
[im 1/96]
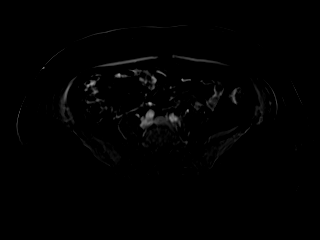
[im 48/96]
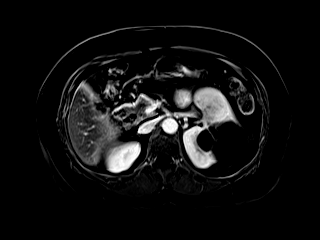
[im 96/96]
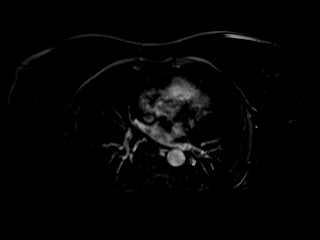

[Series 23: T1 dynamic post-contrast · coronal · 3.0mm · 1.31mm/px · 2 of 72 slices shown (4 of 6)]
[im 1/72]
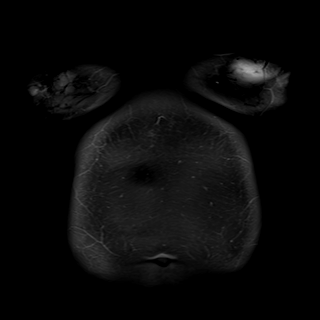
[im 72/72]
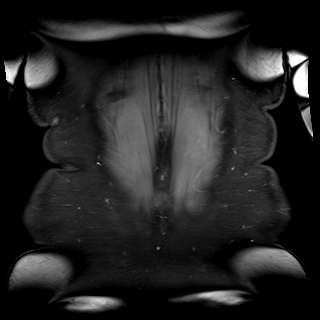

[Series 24: T1 dynamic post-contrast · axial · 3.0mm · 1.38mm/px · z∈[-77,+207]mm · 3 of 96 slices shown (5 of 6)]
[im 1/96]
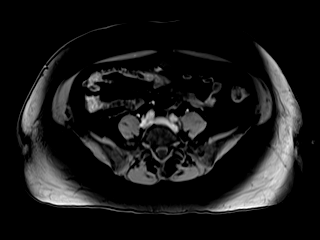
[im 48/96]
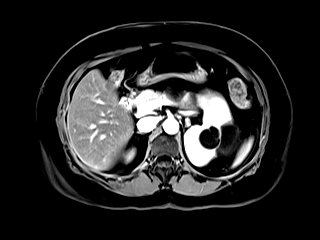
[im 96/96]
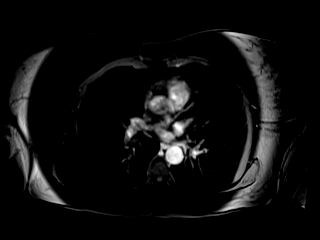

[Series 25: T1 dynamic post-contrast · axial · 3.0mm · 1.38mm/px · z∈[-77,+207]mm · 3 of 96 slices shown (6 of 6)]
[im 1/96]
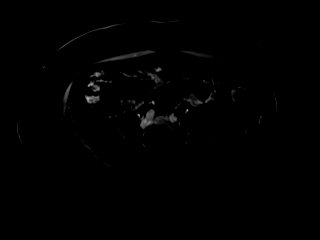
[im 48/96]
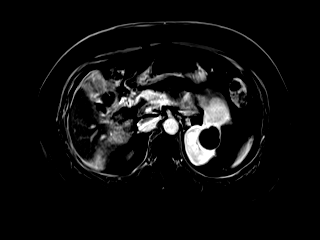
[im 96/96]
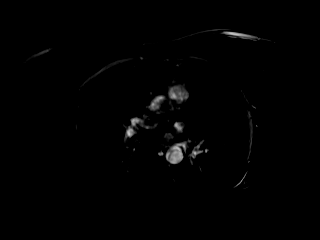

[Series 100: rotate · sagittal · 0.5mm · 0.49mm/px · 1 of 3 slices shown]
[im 1/3]
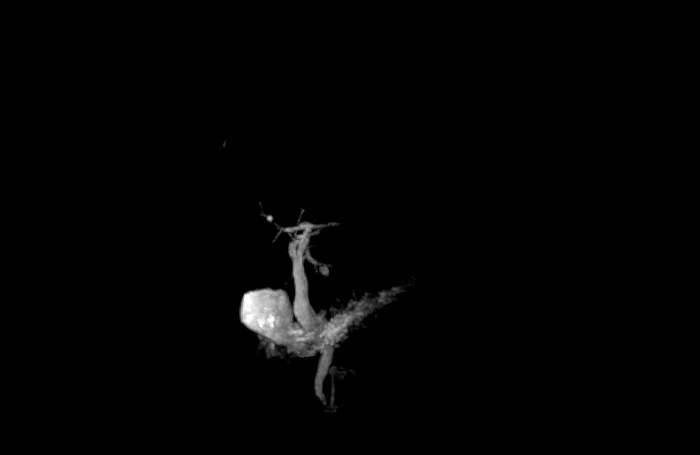

[45 of 48 positions shown; findings below may reference images not displayed]

FINDINGS: Lower chest: No acute abnormality.

Hepatobiliary: Severe diffuse hepatic steatosis with geographic
areas of decreased fatty infiltration including along the hepatic
hilum, gallbladder fossa and falciform ligament. No suspicious
hepatic lesion. Gallbladder surgically absent. Prominence of the
biliary tree with the common bile duct measuring 10 mm, but with
gentle tapering of the distal duct as it extends through the head of
the pancreas to the level of the ampulla. No choledocholithiasis or
suspicious lesions identified.

Pancreas: Intrinsic T1 signal of the pancreatic parenchyma is within
normal limits. Homogeneous postcontrast enhancement of the
pancreatic parenchyma. No pancreatic ductal dilation. No suspicious
pancreatic lesion.

Spleen:  No splenomegaly or suspicious splenic lesion.

Adrenals/Urinary Tract: Bilateral adrenal glands appear normal. No
hydronephrosis. Large left renal cysts measuring 11.7 x 11 cm on
image [DATE] which demonstrates some thin enhancing internal
septations without suspicious enhancing soft tissue nodularity or
wall/septal thickening.

Stomach/Bowel: Periampullary duodenal diverticulum. No pathologic
dilation or evidence of acute inflammation involving loops of large
or small bowel in the abdomen.

Vascular/Lymphatic: Normal caliber abdominal aorta. The portal,
splenic and superior mesenteric veins are patent.

Other:  No significant abdominopelvic free fluid.

Musculoskeletal: No suspicious bone lesions identified.
IMPRESSION: 1. Prominence of the biliary tree with dilation of the common bile
duct but gentle tapering of the distal duct as it extends through
the head of the pancreas to the level of the ampulla, and no
choledocholithiasis, stricture or biliary lesion/extrinsic
compression identified. Findings consistent with reservoir effect
post cholecystectomy.
2. Severe diffuse hepatic steatosis with geographic areas of
decreased fatty infiltration including along the hepatic hilum,
gallbladder fossa and falciform ligament.
3. Benign 11.7 cm Bosniak classification 2 left renal cyst.

## 2022-06-06 NOTE — Telephone Encounter (Signed)
Pt called back today checking on this, informed patient that you are out until next Thursday, pt verbalized understanding.

## 2022-06-09 ENCOUNTER — Encounter: Payer: Self-pay | Admitting: Gastroenterology

## 2022-06-09 NOTE — Telephone Encounter (Signed)
I have written a letter and sent it to her in Mychart. Please let pt know.

## 2022-06-09 NOTE — Telephone Encounter (Signed)
Lmom notifying pt of letter being in her mychart.

## 2022-06-24 ENCOUNTER — Ambulatory Visit: Payer: 59 | Admitting: Gastroenterology

## 2022-10-26 ENCOUNTER — Telehealth: Payer: Self-pay | Admitting: Gastroenterology

## 2022-10-26 NOTE — Telephone Encounter (Signed)
Patient is due for follow up liver labs. Please arrange.   Labs: LFTs IgG/IgA/IgM AMA Smooth muscle Ab ANA ferritin

## 2022-10-28 ENCOUNTER — Other Ambulatory Visit: Payer: Self-pay

## 2022-10-28 DIAGNOSIS — K529 Noninfective gastroenteritis and colitis, unspecified: Secondary | ICD-10-CM

## 2022-10-28 DIAGNOSIS — R7989 Other specified abnormal findings of blood chemistry: Secondary | ICD-10-CM

## 2022-10-28 NOTE — Telephone Encounter (Signed)
Labs have been ordered and have been mailed to the pt to have completed.

## 2023-07-20 ENCOUNTER — Other Ambulatory Visit: Payer: Self-pay

## 2023-07-20 DIAGNOSIS — K529 Noninfective gastroenteritis and colitis, unspecified: Secondary | ICD-10-CM

## 2023-07-20 DIAGNOSIS — R7989 Other specified abnormal findings of blood chemistry: Secondary | ICD-10-CM

## 2023-07-20 NOTE — Addendum Note (Signed)
Addended by: Zada Finders on: 07/20/2023 02:36 PM   Modules accepted: Orders

## 2023-07-24 LAB — HEPATIC FUNCTION PANEL
ALT: 43 [IU]/L — ABNORMAL HIGH (ref 0–32)
AST: 27 [IU]/L (ref 0–40)
Albumin: 4.7 g/dL (ref 3.8–4.9)
Alkaline Phosphatase: 72 [IU]/L (ref 44–121)
Bilirubin Total: 0.3 mg/dL (ref 0.0–1.2)
Bilirubin, Direct: <0.1 mg/dL (ref 0.00–0.40)
Total Protein: 7.6 g/dL (ref 6.0–8.5)

## 2023-07-24 LAB — IGG, IGA, IGM
IgA/Immunoglobulin A, Serum: 411 mg/dL — ABNORMAL HIGH (ref 87–352)
IgG (Immunoglobin G), Serum: 1676 mg/dL — ABNORMAL HIGH (ref 586–1602)
IgM (Immunoglobulin M), Srm: 274 mg/dL — ABNORMAL HIGH (ref 26–217)

## 2023-07-24 LAB — FERRITIN: Ferritin: 380 ng/mL — ABNORMAL HIGH (ref 15–150)

## 2023-07-24 LAB — MITOCHONDRIAL ANTIBODIES: Mitochondrial Ab: <20 U (ref 0.0–20.0)

## 2023-07-24 LAB — ANTI-SMOOTH MUSCLE ANTIBODY, IGG: Smooth Muscle Ab: 5 U (ref 0–19)

## 2023-07-24 LAB — ANA: Anti Nuclear Antibody (ANA): NEGATIVE

## 2023-09-16 ENCOUNTER — Ambulatory Visit (INDEPENDENT_AMBULATORY_CARE_PROVIDER_SITE_OTHER): Payer: 59 | Admitting: Gastroenterology

## 2023-09-16 ENCOUNTER — Encounter: Payer: Self-pay | Admitting: Gastroenterology

## 2023-09-16 VITALS — BP 130/80 | HR 102 | Temp 98.7°F | Ht 60.0 in | Wt 192.0 lb

## 2023-09-16 DIAGNOSIS — A09 Infectious gastroenteritis and colitis, unspecified: Secondary | ICD-10-CM

## 2023-09-16 DIAGNOSIS — R7989 Other specified abnormal findings of blood chemistry: Secondary | ICD-10-CM | POA: Diagnosis not present

## 2023-09-16 DIAGNOSIS — K529 Noninfective gastroenteritis and colitis, unspecified: Secondary | ICD-10-CM

## 2023-09-16 DIAGNOSIS — K219 Gastro-esophageal reflux disease without esophagitis: Secondary | ICD-10-CM

## 2023-09-16 DIAGNOSIS — K625 Hemorrhage of anus and rectum: Secondary | ICD-10-CM | POA: Diagnosis not present

## 2023-09-16 DIAGNOSIS — K649 Unspecified hemorrhoids: Secondary | ICD-10-CM

## 2023-09-16 DIAGNOSIS — K838 Other specified diseases of biliary tract: Secondary | ICD-10-CM

## 2023-09-16 DIAGNOSIS — K580 Irritable bowel syndrome with diarrhea: Secondary | ICD-10-CM | POA: Diagnosis not present

## 2023-09-16 DIAGNOSIS — K760 Fatty (change of) liver, not elsewhere classified: Secondary | ICD-10-CM

## 2023-09-16 NOTE — Patient Instructions (Signed)
Complete labs at your convenience.  Complete stools.  I will touch base with liver specialist in [place] once your labs are back and we will let you know if liver biopsy is needed.   Monitor for recurrent blood in the stool. I suspect bleeding from hemorrhoids but if recurrent symptoms, would consider updating colonoscopy.

## 2023-09-16 NOTE — Progress Notes (Signed)
GI Office Note    Referring Provider: Assunta Found, MD Primary Care Physician:  Assunta Found, MD  Primary Gastroenterologist: Roetta Sessions, MD   Chief Complaint   Chief Complaint  Patient presents with   Follow-up    Discuss liver biopsy, also wants to discuss possible hemorrhoid banding. States that she still has issues with diarrhea. Some days are worse than others.    History of Present Illness   Samantha [name] is a 57 y.o. female presenting today for follow up. Last seen in 05/2022. H/o IBS with diarrhea, GERD, h.pylori with confirmed eradication, h/o elevated LFTs.  Today: notes hemorrhoids on outside of body. Stay out all the time. Wants to discuss hemorrhoid banding. Has some fresh blood in toilet, happened once. Feels like may have been hemorrhoid. BM still with intermittent diarrhea. Days with diarrhea, has at least 3 times per day. Close together, mostly in the morning, and then ok rest of the day. Rare constipation. Uses imodium prn only but tries not take it. Does not use at home or at work. Mostly only uses imodium if going places where restrooms not readily available. Heartburn eased up, no longer taking PPI.  History of abnormal LFTS: mildly elevated transaminases. Work up thus far negative except for ferritin that has been historically elevated and elevated immunoglobulins as outlined. Liver U/S (11/2021)with hepatic steatosis. Dilated CBD to 11mm post cholecystectomy. MRCP (11/2021) prominent CBD without stones or apparent stricture/mass etc.   Updated labs in 06/2023: ALT mildly elevated at 43. IgG 1676H, IgA 411H, IgM 274H, ferritin 380, ANA/AMA/ASMA negative. Discussed with Dr. Jena Gauss, she may have MASH but need to discuss possibility of liver biopsy and/or fibrosis testing.   MRI Abd/MRCP 11/2021: IMPRESSION: 1. Prominence of the biliary tree with dilation of the common bile duct but gentle tapering of the distal duct as it extends through the head of the  pancreas to the level of the ampulla, and no choledocholithiasis, stricture or biliary lesion/extrinsic compression identified. Findings consistent with reservoir effect post cholecystectomy. 2. Severe diffuse hepatic steatosis with geographic areas of decreased fatty infiltration including along the hepatic hilum, gallbladder fossa and falciform ligament. 3. Benign 11.7 cm Bosniak classification 2 left renal cyst.   EGD June 2022: - Small hiatal hernia. - Gastritis. Biopsied.  Chronic active gastritis, H. pylori present. - Normal duodenal bulb, first portion of the duodenum and second portion of the duodenum. Biopsied.  Peptic duodenitis.   Colonoscopy June 2022: - Non-bleeding internal hemorrhoids. - Diverticulosis in the sigmoid colon. - One 2 mm polyp in the ascending colon, removed with a cold biopsy forceps. Resected and retrieved.  Polypoid fragment of benign colonic mucosa with underlying lymphoid aggregates with features suggesting lymphoproliferative process. - The examined portion of the ileum was normal. - Biopsies were taken with a cold forceps from the ascending colon, transverse colon and descending colon for evaluation of microscopic colitis.  Benign colonic mucosa. -next colonoscopy in 5 years due to [facility]  Wt Readings from Last 3 Encounters:  09/16/23 192 lb (87.1 kg)  05/27/22 192 lb (87.1 kg)  11/18/21 185 lb 6.4 oz (84.1 kg)     Medications   Current Outpatient Medications  Medication Sig Dispense Refill   loperamide (IMODIUM) 2 MG capsule Take 2 mg by mouth daily as needed for diarrhea or loose stools.     esomeprazole (NEXIUM) 20 MG capsule Take 20 mg by mouth daily as needed (acid reflux). (Patient not taking: Reported on 09/16/2023)  No current facility-administered medications for this visit.    Allergies   Allergies as of 09/16/2023   (No Known Allergies)       Review of Systems   General: Negative for anorexia, weight loss, fever, chills,  fatigue, weakness. ENT: Negative for hoarseness, difficulty swallowing , nasal congestion. CV: Negative for chest pain, angina, palpitations, dyspnea on exertion, peripheral edema.  Respiratory: Negative for dyspnea at rest, dyspnea on exertion, cough, sputum, wheezing.  GI: See history of present illness. GU:  Negative for dysuria, hematuria, urinary incontinence, urinary frequency, nocturnal urination.  Endo: Negative for unusual weight change.     Physical Exam   BP 130/80 (BP Location: Right Arm, Patient Position: Sitting, Cuff Size: Normal)   Pulse (!) 102   Temp 98.7 F (37.1 C) (Oral)   Ht 5' (1.524 m)   Wt 192 lb (87.1 kg)   LMP 09/12/2015 (Within Days) Comment: sparatic  SpO2 94%   BMI 37.50 kg/m    General: Well-nourished, well-developed in no acute distress.  Eyes: No icterus. Mouth: Oropharyngeal mucosa moist and pink   Abdomen: Bowel sounds are normal, nontender, nondistended, no hepatosplenomegaly or masses,  no abdominal bruits or hernia , no rebound or guarding.  Rectal: not performed  Extremities: No lower extremity edema. No clubbing or deformities. Neuro: Alert and oriented x 4   Skin: Warm and dry, no jaundice.   Psych: Alert and cooperative, normal mood and affect.  Labs   See hpi  Imaging Studies   No results found.  Assessment/Plan:   IBS-D: -stable, using Imodium prn, avoiding food triggers.   GERD: -infrequent symptoms, no longer on PPI  Rectal bleeding: -single episode, likely hemorrhoid related. Patient describes external hemorrhoids vs grade 3/4 internal. By colonoscopy she has internal hemorrhoids. Some of her hemorrhoids are likely eligible for banding but possibly not all. For now she does not want to pursue banding -will monitor for further bleeding, let us know if recurrent  Elevated LFTs: -mildly elevated ALT, IgG/IgA/IgM, ferritin. Not clear if elevated immunoglobulins are related to her liver, may need hematology  evaluation -hepatic steatosis on imaging -dilated CBD on u/s, MRI/MRCP with steatosis but no stones/masses within biliary tress, suspected dilated CBD due to post cholecystectomy reservoir effect.  -fibrosis labs, HFE, CBC, CMET -to discuss findings with hepatology colleagues at [facility] -Instructions for fatty liver: Recommend 1-2# weight loss per week until ideal body weight through exercise & diet. Low fat/cholesterol diet.   Avoid sweets, sodas, fruit juices, sweetened beverages like tea, etc. Gradually increase exercise from 15 min daily up to 1 hr per day 5 days/week. Limit alcohol use. -avoid herbal medications        Leanna Battles. Melvyn Neth, MHS, PA-C [facility]

## 2023-10-15 ENCOUNTER — Other Ambulatory Visit (HOSPITAL_COMMUNITY): Payer: Self-pay | Admitting: Family Medicine

## 2023-10-15 ENCOUNTER — Ambulatory Visit (HOSPITAL_COMMUNITY)
Admission: RE | Admit: 2023-10-15 | Discharge: 2023-10-15 | Disposition: A | Discharge status: Home / Self Care | Payer: 59 | Source: Ambulatory Visit | Attending: Family Medicine | Admitting: Family Medicine

## 2023-10-15 DIAGNOSIS — J069 Acute upper respiratory infection, unspecified: Secondary | ICD-10-CM | POA: Insufficient documentation

## 2023-12-31 NOTE — Progress Notes (Signed)
 GI Office Note    Referring Provider: Assunta Found, MD Primary Care Physician:  Assunta Found, MD  Primary Gastroenterologist: Roetta Sessions, MD   Chief Complaint   Chief Complaint  Patient presents with   Abdominal Pain   Diarrhea    History of Present Illness   Samantha [name] is a 58 y.o. female presenting today for problem visit for abdominal pain and diarrhea. Last seen 08/2023. H/o IBS-D, GERD, elevated LFTS, h.pylori with confirmed eradication.  Normally has intermittent diarrhea to solid, thin strips of stool. She had been doing ok after last OV but during the Holidays she got sick with flu/PNA and was actually a little constipated. January, she had RSV. Sunday, started feeling sick. Some nausea but this has now resolved. Started having diarrhea. Lower abdominal pain. Hurts to sit on the toilet. Cramping quality. Can be severe pain. Having more than 10 stools daily. Out of work since for three days. Saw PCP Wednesday, started on Lomotil. Went to the ER yesterday but too long of a wait. Did have fever two separate days, over 100.3. Abdominal pain really intensifies with eating/drinking. No ill contacts. Some people at work are sick but not sure with what. PCP gave her zofran.   History of abnormal LFTS: mildly elevated transaminases. Work up thus far negative except for ferritin that has been historically elevated and elevated immunoglobulins as outlined. Liver U/S (11/2021)with hepatic steatosis. Dilated CBD to 11mm post cholecystectomy. MRCP (11/2021) prominent CBD without stones or apparent stricture/mass etc.    Updated labs in 06/2023: ALT mildly elevated at 43. IgG 1676H, IgA 411H, IgM 274H, ferritin 380, ANA/AMA/ASMA negative. Discussed with Dr. Jena Gauss, she may have MASH but need to discuss possibility of liver biopsy and/or fibrosis testing.   She did not complete labs after last ov.    MRI Abd/MRCP 11/2021: IMPRESSION: 1. Prominence of the biliary tree with dilation  of the common bile duct but gentle tapering of the distal duct as it extends through the head of the pancreas to the level of the ampulla, and no choledocholithiasis, stricture or biliary lesion/extrinsic compression identified. Findings consistent with reservoir effect post cholecystectomy. 2. Severe diffuse hepatic steatosis with geographic areas of decreased fatty infiltration including along the hepatic hilum, gallbladder fossa and falciform ligament. 3. Benign 11.7 cm Bosniak classification 2 left renal cyst.   EGD June 2022: - Small hiatal hernia. - Gastritis. Biopsied.  Chronic active gastritis, H. pylori present. - Normal duodenal bulb, first portion of the duodenum and second portion of the duodenum. Biopsied.  Peptic duodenitis.   Colonoscopy June 2022: - Non-bleeding internal hemorrhoids. - Diverticulosis in the sigmoid colon. - One 2 mm polyp in the ascending colon, removed with a cold biopsy forceps. Resected and retrieved.  Polypoid fragment of benign colonic mucosa with underlying lymphoid aggregates with features suggesting lymphoproliferative process. - The examined portion of the ileum was normal. - Biopsies were taken with a cold forceps from the ascending colon, transverse colon and descending colon for evaluation of microscopic colitis.  Benign colonic mucosa. -next colonoscopy in 5 years due to FH      Medications   Current Outpatient Medications  Medication Sig Dispense Refill   diphenoxylate-atropine (LOMOTIL) 2.5-0.025 MG tablet Take 1 tablet by mouth 4 (four) times daily as needed.     No current facility-administered medications for this visit.    Allergies   Allergies as of 01/01/2024   (No Known Allergies)     Past Medical  History   Past Medical History:  Diagnosis Date   IBS (irritable bowel syndrome)    PONV (postoperative nausea and vomiting)     Past Surgical History   Past Surgical History:  Procedure Laterality Date    APPENDECTOMY     BIOPSY  04/22/2021   Procedure: BIOPSY;  Surgeon: Lanelle Bal, DO;  [facility];  Service: Endoscopy;;   CHOLECYSTECTOMY  2000   [place]   COLONOSCOPY  2004   Dr. Jena Gauss: normal ileoscolonoscopy, internal hemorrhodis   COLONOSCOPY  2009   [id number].   COLONOSCOPY N/A 09/27/2015   [facility]: Normal ileo-colonoscopy staus post biopsy and stool sampling   COLONOSCOPY WITH PROPOFOL N/A 04/22/2021   Procedure: COLONOSCOPY WITH PROPOFOL;  Surgeon: Lanelle Bal, DO;  [facility]: [facility];  Service: Endoscopy;  Laterality: N/A;  2:15pm needs Colonoscopy with possible ileocolonoscopy/Random colon biopsies   ESOPHAGOGASTRODUODENOSCOPY (EGD) WITH PROPOFOL N/A 04/22/2021   Procedure: ESOPHAGOGASTRODUODENOSCOPY (EGD) WITH PROPOFOL;  Surgeon: Lanelle Bal, DO;  [facility];  Service: Endoscopy;  Laterality: N/A;   POLYPECTOMY  04/22/2021   Procedure: POLYPECTOMY;  Surgeon: Lanelle Bal, DO;  [facility];  Service: Endoscopy;;    Past Family History   Family History  Problem Relation Age of Onset   Colon cancer Father        [place], around age 51, colon polyps. diagosed with colon cancer arournd age 62   Colon cancer Paternal Aunt        9s   Colon cancer Paternal Aunt        82   Inflammatory bowel disease Neg Hx    Celiac disease Neg Hx     Past Social History   Social History   Socioeconomic History   Marital status: Married    Spouse name: Not on file   Number of children: 1   Years of education: Not on file   Highest education level: Not on file  Occupational History   Not on file  Tobacco Use   Smoking status: Never   Smokeless tobacco: Never  Vaping Use   Vaping status: Never Used  Substance and Sexual Activity   Alcohol use: No    Alcohol/week: 0.0 standard drinks of alcohol   Drug use: No   Sexual activity: Yes  Other Topics Concern   Not on file  Social History Narrative   Not on file    Social Drivers of Health   Financial Resource Strain: Not on file  Food Insecurity: Not on file  Transportation Needs: Not on file  Physical Activity: Not on file  Stress: Not on file  Social Connections: Not on file  Intimate Partner Violence: Not on file    Review of Systems   General: Negative for anorexia, weight loss, chills, fatigue, weakness. See hpi ENT: Negative for hoarseness, difficulty swallowing , nasal congestion. CV: Negative for chest pain, angina, palpitations, dyspnea on exertion, peripheral edema.  Respiratory: Negative for dyspnea at rest, dyspnea on exertion, cough, sputum, wheezing.  GI: See history of present illness. GU:  Negative for dysuria, hematuria, urinary incontinence, urinary frequency, nocturnal urination.  Endo: Negative for unusual weight change.     Physical Exam   BP 113/74 (BP Location: Right Arm, Patient Position: Sitting, Cuff Size: Large)   Pulse 74   Temp 98.7 F (37.1 C) (Oral)   Ht 5' (1.524 m)   Wt 190 lb (86.2 kg)   LMP 09/12/2015 (Within  Days) Comment: sparatic  SpO2 96%   BMI 37.11 kg/m    General: Well-nourished, well-developed in no acute distress. Appears uncomfortable, tearful. Eyes: No icterus. Mouth: Oropharyngeal mucosa moist and pink   Lungs: Clear to auscultation bilaterally.  Heart: Regular rate and rhythm, no murmurs rubs or gallops.  Abdomen: Bowel sounds are normal,  nondistended, no hepatosplenomegaly or masses,  no abdominal bruits or hernia , no rebound or guarding. Mostly left sided pain/LLQ and into right lower abdomen. Rectal: not performed  Extremities: No lower extremity edema. No clubbing or deformities. Neuro: Alert and oriented x 4   Skin: Warm and dry, no jaundice.   Psych: Alert and cooperative, normal mood and affect.  Labs   Lab Results  Component Value Date   ALT 43 (H) 07/23/2023   AST 27 07/23/2023   ALKPHOS 72 07/23/2023   BILITOT 0.3 07/23/2023   Lab Results  Component Value  Date   NA 136 04/24/2010   CL 104 04/24/2010   K 3.1 (L) 04/24/2010   CO2 24 04/24/2010   BUN 8 04/24/2010   CREATININE 0.55 04/24/2010   CALCIUM 9.5 04/24/2010   ALBUMIN 4.7 07/23/2023   GLUCOSE 83 04/24/2010   Lab Results  Component Value Date   WBC 5.5 04/24/2010   HGB 12.7 04/24/2010   HCT 36.4 04/24/2010   MCV 91.1 04/24/2010   PLT 189 04/24/2010    Imaging Studies   No results found.  Assessment/Plan:   Acute on chronic diarrhea, nausea, fever, abdominal pain: -several recent illness with use of antibiotics -need to rule out colitis, diverticulitis -stat CT A/P with contrast -CBC, CMET, lipase today -stool for Cdiff, GI profile -hold off on lomotil and imodium for now, until CT complete  IBS-D -baseline symptoms stable until acute illness -typically uses imodium prn, avoids food triggers  GERD: -stable, no longer on PPI  Elevated LFTS:  -mildly elevated ALT, IgG/IgA/IgM, ferritin. Not clear if elevated immunoglobulins are related to her liver, may need hematology evaluation -hepatic steatosis on imaging -dilated CBD on u/s, MRI/MRCP with steatosis but no stones/masses within biliary tress, suspected dilated CBD due to post cholecystectomy reservoir effect.  -recommending she complete labs ordered on 08/2023 -return ov in four months  Leanna Battles. Melvyn Neth, MHS, PA-C [facility]

## 2024-01-01 ENCOUNTER — Ambulatory Visit (INDEPENDENT_AMBULATORY_CARE_PROVIDER_SITE_OTHER): Payer: PRIVATE HEALTH INSURANCE | Admitting: Gastroenterology

## 2024-01-01 ENCOUNTER — Telehealth: Payer: Self-pay | Admitting: *Deleted

## 2024-01-01 ENCOUNTER — Encounter: Payer: Self-pay | Admitting: Gastroenterology

## 2024-01-01 ENCOUNTER — Other Ambulatory Visit (HOSPITAL_COMMUNITY)
Admission: RE | Admit: 2024-01-01 | Discharge: 2024-01-01 | Disposition: A | Discharge status: Home / Self Care | Payer: PRIVATE HEALTH INSURANCE | Source: Ambulatory Visit | Attending: Gastroenterology | Admitting: Gastroenterology

## 2024-01-01 ENCOUNTER — Inpatient Hospital Stay: Admission: RE | Admit: 2024-01-01 | Payer: PRIVATE HEALTH INSURANCE | Source: Ambulatory Visit

## 2024-01-01 VITALS — BP 113/74 | HR 74 | Temp 98.7°F | Ht 60.0 in | Wt 190.0 lb

## 2024-01-01 DIAGNOSIS — R1030 Lower abdominal pain, unspecified: Secondary | ICD-10-CM | POA: Diagnosis not present

## 2024-01-01 DIAGNOSIS — R1032 Left lower quadrant pain: Secondary | ICD-10-CM | POA: Insufficient documentation

## 2024-01-01 DIAGNOSIS — K219 Gastro-esophageal reflux disease without esophagitis: Secondary | ICD-10-CM | POA: Diagnosis not present

## 2024-01-01 DIAGNOSIS — R509 Fever, unspecified: Secondary | ICD-10-CM

## 2024-01-01 DIAGNOSIS — R7401 Elevation of levels of liver transaminase levels: Secondary | ICD-10-CM

## 2024-01-01 DIAGNOSIS — K580 Irritable bowel syndrome with diarrhea: Secondary | ICD-10-CM | POA: Diagnosis not present

## 2024-01-01 DIAGNOSIS — K760 Fatty (change of) liver, not elsewhere classified: Secondary | ICD-10-CM | POA: Diagnosis not present

## 2024-01-01 DIAGNOSIS — A09 Infectious gastroenteritis and colitis, unspecified: Secondary | ICD-10-CM

## 2024-01-01 LAB — CBC WITH DIFFERENTIAL/PLATELET
Abs Immature Granulocytes: 0.01 10*3/uL (ref 0.00–0.07)
Basophils Absolute: 0 10*3/uL (ref 0.0–0.1)
Basophils Relative: 1 %
Eosinophils Absolute: 0.1 10*3/uL (ref 0.0–0.5)
Eosinophils Relative: 2 %
HCT: 38 % (ref 36.0–46.0)
Hemoglobin: 12.9 g/dL (ref 12.0–15.0)
Immature Granulocytes: 0 %
Lymphocytes Relative: 22 %
Lymphs Abs: 1.3 10*3/uL (ref 0.7–4.0)
MCH: 31.2 pg (ref 26.0–34.0)
MCHC: 33.9 g/dL (ref 30.0–36.0)
MCV: 91.8 fL (ref 80.0–100.0)
Monocytes Absolute: 0.3 10*3/uL (ref 0.1–1.0)
Monocytes Relative: 4 %
Neutro Abs: 4.4 10*3/uL (ref 1.7–7.7)
Neutrophils Relative %: 71 %
Platelets: 300 10*3/uL (ref 150–400)
RBC: 4.14 MIL/uL (ref 3.87–5.11)
RDW: 12.3 % (ref 11.5–15.5)
WBC: 6.1 10*3/uL (ref 4.0–10.5)
nRBC: 0 % (ref 0.0–0.2)

## 2024-01-01 LAB — COMPREHENSIVE METABOLIC PANEL
ALT: 53 U/L — ABNORMAL HIGH (ref 0–44)
AST: 38 U/L (ref 15–41)
Albumin: 3.7 g/dL (ref 3.5–5.0)
Alkaline Phosphatase: 62 U/L (ref 38–126)
Anion gap: 12 (ref 5–15)
BUN: 9 mg/dL (ref 6–20)
CO2: 25 mmol/L (ref 22–32)
Calcium: 10 mg/dL (ref 8.9–10.3)
Chloride: 103 mmol/L (ref 98–111)
Creatinine, Ser: 0.52 mg/dL (ref 0.44–1.00)
GFR, Estimated: >60 mL/min (ref >60)
Glucose, Bld: 104 mg/dL — ABNORMAL HIGH (ref 70–99)
Potassium: 3.7 mmol/L (ref 3.5–5.1)
Sodium: 140 mmol/L (ref 135–145)
Total Bilirubin: 0.3 mg/dL (ref 0.0–1.2)
Total Protein: 8.2 g/dL — ABNORMAL HIGH (ref 6.5–8.1)

## 2024-01-01 NOTE — Patient Instructions (Addendum)
 Complete CT and labs today. I will reach out by Mychart with results and instructions.   Hold off on lomotil, imodium, or Pepto for now.  Complete labs from 08/2023 when you are feeling better.  Return office visit in four months.

## 2024-01-01 NOTE — Telephone Encounter (Signed)
 Called [facility]. Was advised no PA is required for CT A/P but [facility] was not in network for imaging. She will have to go to an imaging center like [facility]. Ref# [id number]  Called [facility] and since she is stat she will have to be a walk in. Directed patient there for her CT.

## 2024-01-04 NOTE — Telephone Encounter (Addendum)
 Spoke with [facility] as it stated appt cancelled due to no auth. Advised I placed in notes no PA required and ref# for that. She will inform the lady that handles the insurance part and will call patient to schedule.   I called [facility] again and was advised [facility] is in network with them. He gave me another call ref # [id number]. I have called [facility] again and let them know. They will contact patient to reschedule.

## 2024-01-05 ENCOUNTER — Other Ambulatory Visit (HOSPITAL_COMMUNITY)
Admission: RE | Admit: 2024-01-05 | Discharge: 2024-01-05 | Disposition: A | Discharge status: Home / Self Care | Payer: PRIVATE HEALTH INSURANCE | Source: Ambulatory Visit | Attending: Gastroenterology | Admitting: Gastroenterology

## 2024-01-05 DIAGNOSIS — R509 Fever, unspecified: Secondary | ICD-10-CM | POA: Diagnosis not present

## 2024-01-05 DIAGNOSIS — R1032 Left lower quadrant pain: Secondary | ICD-10-CM | POA: Diagnosis present

## 2024-01-05 DIAGNOSIS — R1030 Lower abdominal pain, unspecified: Secondary | ICD-10-CM | POA: Insufficient documentation

## 2024-01-05 LAB — MISC LABCORP TEST (SEND OUT): Labcorp test code: 550960

## 2024-01-06 LAB — GASTROINTESTINAL PANEL BY PCR, STOOL (REPLACES STOOL CULTURE)

## 2024-01-08 ENCOUNTER — Ambulatory Visit
Admission: RE | Admit: 2024-01-08 | Discharge: 2024-01-08 | Disposition: A | Discharge status: Home / Self Care | Payer: PRIVATE HEALTH INSURANCE | Source: Ambulatory Visit | Attending: Gastroenterology | Admitting: Gastroenterology

## 2024-01-08 LAB — HEMOCHROMATOSIS DNA-PCR(C282Y,H63D)

## 2024-01-08 LAB — MISC LABCORP TEST (SEND OUT): Labcorp test code: 511345

## 2024-01-08 MED ORDER — IOPAMIDOL (ISOVUE-300) INJECTION 61%
500.0000 mL | Freq: Once | INTRAVENOUS | Status: AC | PRN
  Administered 2024-01-08: 100 mL via INTRAVENOUS

## 2024-01-11 ENCOUNTER — Other Ambulatory Visit: Payer: Self-pay | Admitting: Gastroenterology

## 2024-01-11 ENCOUNTER — Other Ambulatory Visit: Payer: Self-pay

## 2024-01-11 DIAGNOSIS — A09 Infectious gastroenteritis and colitis, unspecified: Secondary | ICD-10-CM

## 2024-01-11 MED ORDER — CIPROFLOXACIN HCL 500 MG PO TABS: 500.0000 mg | ORAL_TABLET | Freq: Two times a day (BID) | ORAL | 0 refills | Status: AC

## 2024-01-11 MED ORDER — METRONIDAZOLE 500 MG PO TABS: 500.0000 mg | ORAL_TABLET | Freq: Three times a day (TID) | ORAL | 0 refills | Status: AC

## 2024-02-15 ENCOUNTER — Encounter: Payer: Self-pay | Admitting: Gastroenterology

## 2024-02-15 ENCOUNTER — Ambulatory Visit: Payer: PRIVATE HEALTH INSURANCE | Admitting: Gastroenterology

## 2024-02-15 VITALS — BP 111/74 | HR 72 | Temp 98.4°F | Ht 60.0 in | Wt 187.2 lb

## 2024-02-15 DIAGNOSIS — K219 Gastro-esophageal reflux disease without esophagitis: Secondary | ICD-10-CM | POA: Diagnosis not present

## 2024-02-15 DIAGNOSIS — K760 Fatty (change of) liver, not elsewhere classified: Secondary | ICD-10-CM | POA: Diagnosis not present

## 2024-02-15 DIAGNOSIS — R197 Diarrhea, unspecified: Secondary | ICD-10-CM

## 2024-02-15 DIAGNOSIS — K838 Other specified diseases of biliary tract: Secondary | ICD-10-CM | POA: Diagnosis not present

## 2024-02-15 DIAGNOSIS — R7989 Other specified abnormal findings of blood chemistry: Secondary | ICD-10-CM | POA: Diagnosis not present

## 2024-02-15 DIAGNOSIS — R933 Abnormal findings on diagnostic imaging of other parts of digestive tract: Secondary | ICD-10-CM

## 2024-02-15 DIAGNOSIS — K580 Irritable bowel syndrome with diarrhea: Secondary | ICD-10-CM

## 2024-02-15 NOTE — Progress Notes (Unsigned)
 GI Office Note    Referring Provider: Minus Amel, MD Primary Care Physician:  Minus Amel, MD  Primary Gastroenterologist: Rheba Cedar, MD   Chief Complaint   Chief Complaint  Patient presents with   Follow-up    Doing well, no issues per pt    History of Present Illness   Samantha [name] is a 58 y.o. female presenting today for follow up. Last seen 12/2023. H/o IBS-D, GERD, elevated LFTs, diverticulitis.  Stools are better, some days normal and occasional loose stool. Abdominal pain improved. No longer needing imodium. Keeps Lomotil on hand but uses rarely. Continues to improve diet, cutting back on sugar. Walking every morning before work. She states she dropped off Cdiff stool test this morning at [facility]. It had been omitted from her testing by the lab previously.  History of abnormal LFTS: mildly elevated transaminases. Work up thus far negative except for ferritin that has been historically elevated and elevated immunoglobulins as outlined. Liver U/S (11/2021)with hepatic steatosis. Dilated CBD to 11mm post cholecystectomy. MRCP (11/2021) prominent CBD without stones or apparent stricture/mass etc. Latest imaging by CT 12/2023 when she was being evaluated for abdominal pain, see below.   Labs in 06/2023: ALT mildly elevated at 43. IgG 1676H, IgA 411H, IgM 274H, ferritin 380, ANA/AMA/ASMA negative. Discussed with Dr. 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 Alb 3.7. AST 38, ALT 53H, Tbili 0.3, AP 62. Platelets 300. Hgb 12.9. NAFLD F0-F2.    Wt Readings from Last 3 Encounters:  02/15/24 187 lb 3.2 oz (84.9 kg)  01/01/24 190 lb (86.2 kg)  09/16/23 192 lb (87.1 kg)    CT A/P with contrast 12/2023: IMPRESSION: 1. Findings most consistent with mild acute diverticulitis of the sigmoid colon. No perforation or abscess. 2. Large  benign minimally complex cyst of the LEFT kidney not changed from comparison exams. Bosniak 2 renal cyst. No follow-up recommended. 3. Hepatic steatosis  MRI Abd/MRCP 11/2021: IMPRESSION: 1. Prominence of the biliary tree with dilation of the common bile duct but gentle tapering of the distal duct as it extends through the head of the pancreas to the level of the ampulla, and no choledocholithiasis, stricture or biliary lesion/extrinsic compression identified. Findings consistent with reservoir effect post cholecystectomy. 2. Severe diffuse hepatic steatosis with geographic areas of decreased fatty infiltration including along the hepatic hilum, gallbladder fossa and falciform ligament. 3. Benign 11.7 cm Bosniak classification 2 left renal cyst.   EGD June 2022: - Small hiatal hernia. - Gastritis. Biopsied.  Chronic active gastritis, H. pylori present. - Normal duodenal bulb, first portion of the duodenum and second portion of the duodenum. Biopsied.  Peptic duodenitis.   Colonoscopy June 2022: - Non-bleeding internal hemorrhoids. - Diverticulosis in the sigmoid colon. - One 2 mm polyp in the ascending colon, removed with a cold biopsy forceps. Resected and retrieved.  Polypoid fragment of benign colonic mucosa with underlying lymphoid aggregates with features suggesting lymphoproliferative process. - The examined portion of the ileum was normal. - Biopsies were taken with a cold forceps from the ascending colon, transverse colon and descending colon for evaluation of microscopic colitis.  Benign colonic mucosa. -next colonoscopy in 5 years due to FH       Medications   Current Outpatient Medications  Medication Sig Dispense Refill   albuterol (VENTOLIN HFA) 108 (90 Base) MCG/ACT inhaler  Inhale 2 puffs into the lungs every 4 (four) hours as needed.     diphenoxylate-atropine (LOMOTIL) 2.5-0.025 MG tablet Take 1 tablet by mouth 4 (four) times daily as needed.     No current  facility-administered medications for this visit.    Allergies   Allergies as of 02/15/2024   (No Known Allergies)       Review of Systems   General: Negative for anorexia, weight loss, fever, chills, fatigue, weakness. ENT: Negative for hoarseness, difficulty swallowing , nasal congestion. CV: Negative for chest pain, angina, palpitations, dyspnea on exertion, peripheral edema.  Respiratory: Negative for dyspnea at rest, dyspnea on exertion, cough, sputum, wheezing.  GI: See history of present illness. GU:  Negative for dysuria, hematuria, urinary incontinence, urinary frequency, nocturnal urination.  Endo: Negative for unusual weight change.     Physical Exam   BP 111/74 (BP Location: Right Arm, Patient Position: Sitting, Cuff Size: Large)   Pulse 72   Temp 98.4 F (36.9 C) (Oral)   Ht 5' (1.524 m)   Wt 187 lb 3.2 oz (84.9 kg)   LMP 09/12/2015 (Within Days) Comment: sparatic  SpO2 97%   BMI 36.56 kg/m    General: Well-nourished, well-developed in no acute distress.  Eyes: No icterus. Mouth: Oropharyngeal mucosa moist and pink  Abdomen: Bowel sounds are normal, nontender, nondistended, no hepatosplenomegaly or masses,  no abdominal bruits or hernia , no rebound or guarding.  Rectal: not performed  Extremities: No lower extremity edema. No clubbing or deformities. Neuro: Alert and oriented x 4   Skin: Warm and dry, no jaundice.   Psych: Alert and cooperative, normal mood and affect.  Labs   See hpi  Imaging Studies   No results found.  Assessment/Plan:   Acute on chronic diarrhea, abdominal pain: -improved, CT showed acute diverticulitis -continues to have intermittent diarrhea, but much better, closer to her baseline with IBS-D -follow up Cdiff testing as available -consider updating colonoscopy as it has been 3 years and recent CT findings with suspected diverticulitis but need to consider other underlying etiologies, await Cdiff first    GERD: -stable  Elevated LFTs/hepatic steatosis: -mildly elevated ALT, IgG/IgA/IgM, ferritin (neg hemochromatosis genetic markers). -hepatic steatosis on imaging -dilated CBD on u/s, MRI/MRCP with steatosis but no stones/masses within biliary tress, suspected dilated CBD due to post cholecystectomy reservoir effect.  -Fibrosure, NAFLD favorable -continue to follow labs again in 3 months. -Instructions for fatty liver: Recommend 1-2# weight loss per week until ideal body weight through exercise & diet. Low fat/cholesterol diet.   Avoid sweets, sodas, fruit juices, sweetened beverages like tea, etc. Gradually increase exercise from 15 min daily up to 1 hr per day 5 days/week. Limit alcohol use.     Trudie Fuse. Harles Lied, MHS, PA-C [facility]

## 2024-02-15 NOTE — H&P (View-Only) (Signed)
 GI Office Note    Referring Provider: Minus Amel, MD Primary Care Physician:  Minus Amel, MD  Primary Gastroenterologist: Rheba Cedar, MD   Chief Complaint   Chief Complaint  Patient presents with   Follow-up    Doing well, no issues per pt    History of Present Illness   Samantha [name] is a 58 y.o. female presenting today for follow up. Last seen 12/2023. H/o IBS-D, GERD, elevated LFTs, diverticulitis.  Stools are better, some days normal and occasional loose stool. Abdominal pain improved. No longer needing imodium. Keeps Lomotil on hand but uses rarely. Continues to improve diet, cutting back on sugar. Walking every morning before work. She states she dropped off Cdiff stool test this morning at [facility]. It had been omitted from her testing by the lab previously.  History of abnormal LFTS: mildly elevated transaminases. Work up thus far negative except for ferritin that has been historically elevated and elevated immunoglobulins as outlined. Liver U/S (11/2021)with hepatic steatosis. Dilated CBD to 11mm post cholecystectomy. MRCP (11/2021) prominent CBD without stones or apparent stricture/mass etc. Latest imaging by CT 12/2023 when she was being evaluated for abdominal pain, see below.   Labs in 06/2023: ALT mildly elevated at 43. IgG 1676H, IgA 411H, IgM 274H, ferritin 380, ANA/AMA/ASMA negative. Discussed with Dr. 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 Alb 3.7. AST 38, ALT 53H, Tbili 0.3, AP 62. Platelets 300. Hgb 12.9. NAFLD F0-F2.    Wt Readings from Last 3 Encounters:  02/15/24 187 lb 3.2 oz (84.9 kg)  01/01/24 190 lb (86.2 kg)  09/16/23 192 lb (87.1 kg)    CT A/P with contrast 12/2023: IMPRESSION: 1. Findings most consistent with mild acute diverticulitis of the sigmoid colon. No perforation or abscess. 2. Large  benign minimally complex cyst of the LEFT kidney not changed from comparison exams. Bosniak 2 renal cyst. No follow-up recommended. 3. Hepatic steatosis  MRI Abd/MRCP 11/2021: IMPRESSION: 1. Prominence of the biliary tree with dilation of the common bile duct but gentle tapering of the distal duct as it extends through the head of the pancreas to the level of the ampulla, and no choledocholithiasis, stricture or biliary lesion/extrinsic compression identified. Findings consistent with reservoir effect post cholecystectomy. 2. Severe diffuse hepatic steatosis with geographic areas of decreased fatty infiltration including along the hepatic hilum, gallbladder fossa and falciform ligament. 3. Benign 11.7 cm Bosniak classification 2 left renal cyst.   EGD June 2022: - Small hiatal hernia. - Gastritis. Biopsied.  Chronic active gastritis, H. pylori present. - Normal duodenal bulb, first portion of the duodenum and second portion of the duodenum. Biopsied.  Peptic duodenitis.   Colonoscopy June 2022: - Non-bleeding internal hemorrhoids. - Diverticulosis in the sigmoid colon. - One 2 mm polyp in the ascending colon, removed with a cold biopsy forceps. Resected and retrieved.  Polypoid fragment of benign colonic mucosa with underlying lymphoid aggregates with features suggesting lymphoproliferative process. - The examined portion of the ileum was normal. - Biopsies were taken with a cold forceps from the ascending colon, transverse colon and descending colon for evaluation of microscopic colitis.  Benign colonic mucosa. -next colonoscopy in 5 years due to FH       Medications   Current Outpatient Medications  Medication Sig Dispense Refill   albuterol (VENTOLIN HFA) 108 (90 Base) MCG/ACT inhaler  Inhale 2 puffs into the lungs every 4 (four) hours as needed.     diphenoxylate-atropine (LOMOTIL) 2.5-0.025 MG tablet Take 1 tablet by mouth 4 (four) times daily as needed.     No current  facility-administered medications for this visit.    Allergies   Allergies as of 02/15/2024   (No Known Allergies)       Review of Systems   General: Negative for anorexia, weight loss, fever, chills, fatigue, weakness. ENT: Negative for hoarseness, difficulty swallowing , nasal congestion. CV: Negative for chest pain, angina, palpitations, dyspnea on exertion, peripheral edema.  Respiratory: Negative for dyspnea at rest, dyspnea on exertion, cough, sputum, wheezing.  GI: See history of present illness. GU:  Negative for dysuria, hematuria, urinary incontinence, urinary frequency, nocturnal urination.  Endo: Negative for unusual weight change.     Physical Exam   BP 111/74 (BP Location: Right Arm, Patient Position: Sitting, Cuff Size: Large)   Pulse 72   Temp 98.4 F (36.9 C) (Oral)   Ht 5' (1.524 m)   Wt 187 lb 3.2 oz (84.9 kg)   LMP 09/12/2015 (Within Days) Comment: sparatic  SpO2 97%   BMI 36.56 kg/m    General: Well-nourished, well-developed in no acute distress.  Eyes: No icterus. Mouth: Oropharyngeal mucosa moist and pink  Abdomen: Bowel sounds are normal, nontender, nondistended, no hepatosplenomegaly or masses,  no abdominal bruits or hernia , no rebound or guarding.  Rectal: not performed  Extremities: No lower extremity edema. No clubbing or deformities. Neuro: Alert and oriented x 4   Skin: Warm and dry, no jaundice.   Psych: Alert and cooperative, normal mood and affect.  Labs   See hpi  Imaging Studies   No results found.  Assessment/Plan:   Acute on chronic diarrhea, abdominal pain: -improved, CT showed acute diverticulitis -continues to have intermittent diarrhea, but much better, closer to her baseline with IBS-D -follow up Cdiff testing as available -consider updating colonoscopy as it has been 3 years and recent CT findings with suspected diverticulitis but need to consider other underlying etiologies, await Cdiff first    GERD: -stable  Elevated LFTs/hepatic steatosis: -mildly elevated ALT, IgG/IgA/IgM, ferritin (neg hemochromatosis genetic markers). -hepatic steatosis on imaging -dilated CBD on u/s, MRI/MRCP with steatosis but no stones/masses within biliary tress, suspected dilated CBD due to post cholecystectomy reservoir effect.  -Fibrosure, NAFLD favorable -continue to follow labs again in 3 months. -Instructions for fatty liver: Recommend 1-2# weight loss per week until ideal body weight through exercise & diet. Low fat/cholesterol diet.   Avoid sweets, sodas, fruit juices, sweetened beverages like tea, etc. Gradually increase exercise from 15 min daily up to 1 hr per day 5 days/week. Limit alcohol use.     Trudie Fuse. Harles Lied, MHS, PA-C [facility]

## 2024-02-15 NOTE — Patient Instructions (Signed)
 Glad you are feeling better!  We will be in touch with your stool results as they become available.   Continue working on increasing your physical activity and reducing sugar/fat in your diet. You are doing great!

## 2024-02-16 LAB — GI PROFILE, STOOL, PCR

## 2024-02-17 ENCOUNTER — Other Ambulatory Visit: Payer: Self-pay

## 2024-02-17 DIAGNOSIS — R7989 Other specified abnormal findings of blood chemistry: Secondary | ICD-10-CM

## 2024-02-19 ENCOUNTER — Encounter: Payer: Self-pay | Admitting: *Deleted

## 2024-02-19 ENCOUNTER — Other Ambulatory Visit: Payer: Self-pay | Admitting: *Deleted

## 2024-02-19 MED ORDER — PEG 3350-KCL-NA BICARB-NACL 420 G PO SOLR: 4000.0000 mL | Freq: Once | ORAL | 0 refills | Status: AC

## 2024-03-04 ENCOUNTER — Encounter (HOSPITAL_COMMUNITY): Payer: Self-pay | Admitting: Internal Medicine

## 2024-03-04 ENCOUNTER — Ambulatory Visit (HOSPITAL_COMMUNITY)
Admission: RE | Admit: 2024-03-04 | Discharge: 2024-03-04 | Disposition: A | Discharge status: Home / Self Care | Payer: PRIVATE HEALTH INSURANCE | Attending: Internal Medicine | Admitting: Internal Medicine

## 2024-03-04 ENCOUNTER — Ambulatory Visit (HOSPITAL_COMMUNITY): Payer: PRIVATE HEALTH INSURANCE | Admitting: Certified Registered Nurse Anesthetist

## 2024-03-04 ENCOUNTER — Other Ambulatory Visit: Payer: Self-pay

## 2024-03-04 ENCOUNTER — Encounter (HOSPITAL_COMMUNITY)
Admission: RE | Disposition: A | Discharge status: Home / Self Care | Payer: Self-pay | Source: Home / Self Care | Attending: Internal Medicine

## 2024-03-04 DIAGNOSIS — K2950 Unspecified chronic gastritis without bleeding: Secondary | ICD-10-CM | POA: Insufficient documentation

## 2024-03-04 DIAGNOSIS — K635 Polyp of colon: Secondary | ICD-10-CM

## 2024-03-04 DIAGNOSIS — I10 Essential (primary) hypertension: Secondary | ICD-10-CM | POA: Insufficient documentation

## 2024-03-04 DIAGNOSIS — K2980 Duodenitis without bleeding: Secondary | ICD-10-CM | POA: Insufficient documentation

## 2024-03-04 DIAGNOSIS — K219 Gastro-esophageal reflux disease without esophagitis: Secondary | ICD-10-CM | POA: Diagnosis not present

## 2024-03-04 DIAGNOSIS — K648 Other hemorrhoids: Secondary | ICD-10-CM | POA: Insufficient documentation

## 2024-03-04 DIAGNOSIS — K760 Fatty (change of) liver, not elsewhere classified: Secondary | ICD-10-CM | POA: Diagnosis not present

## 2024-03-04 DIAGNOSIS — R7989 Other specified abnormal findings of blood chemistry: Secondary | ICD-10-CM | POA: Diagnosis not present

## 2024-03-04 DIAGNOSIS — K5730 Diverticulosis of large intestine without perforation or abscess without bleeding: Secondary | ICD-10-CM

## 2024-03-04 DIAGNOSIS — K580 Irritable bowel syndrome with diarrhea: Secondary | ICD-10-CM | POA: Diagnosis not present

## 2024-03-04 DIAGNOSIS — K449 Diaphragmatic hernia without obstruction or gangrene: Secondary | ICD-10-CM | POA: Diagnosis not present

## 2024-03-04 DIAGNOSIS — K644 Residual hemorrhoidal skin tags: Secondary | ICD-10-CM | POA: Diagnosis not present

## 2024-03-04 DIAGNOSIS — K5732 Diverticulitis of large intestine without perforation or abscess without bleeding: Secondary | ICD-10-CM | POA: Diagnosis not present

## 2024-03-04 DIAGNOSIS — N281 Cyst of kidney, acquired: Secondary | ICD-10-CM | POA: Insufficient documentation

## 2024-03-04 DIAGNOSIS — D125 Benign neoplasm of sigmoid colon: Secondary | ICD-10-CM | POA: Diagnosis not present

## 2024-03-04 DIAGNOSIS — K649 Unspecified hemorrhoids: Secondary | ICD-10-CM

## 2024-03-04 HISTORY — PX: COLONOSCOPY: SHX5424

## 2024-03-04 SURGERY — COLONOSCOPY
Anesthesia: General

## 2024-03-04 MED ORDER — LACTATED RINGERS IV SOLN: INTRAVENOUS | Status: DC | PRN

## 2024-03-04 MED ORDER — PROPOFOL 500 MG/50ML IV EMUL
INTRAVENOUS | Status: AC
  Filled 2024-03-04: qty 50

## 2024-03-04 MED ORDER — PROPOFOL 500 MG/50ML IV EMUL
INTRAVENOUS | Status: DC | PRN
  Administered 2024-03-04: 60 mg via INTRAVENOUS
  Administered 2024-03-04: 150 ug/kg/min via INTRAVENOUS

## 2024-03-04 NOTE — Interval H&P Note (Signed)
 History and Physical Interval Note:  03/04/2024 8:58 AM  Samantha [name]  has presented today for surgery, with the diagnosis of abd pain, diarrhea.  The various methods of treatment have been discussed with the patient and family. After consideration of risks, benefits and other options for treatment, the patient has consented to  Procedure(s) with comments: COLONOSCOPY (N/A) - 9:30 am, asa 2 as a surgical intervention.  The patient's history has been reviewed, patient examined, no change in status, stable for surgery.  I have reviewed the patient's chart and labs.  Questions were answered to the patient's satisfaction.     Vinetta Greening

## 2024-03-04 NOTE — Anesthesia Preprocedure Evaluation (Signed)
 Anesthesia Evaluation  Patient identified by MRN, date of birth, ID band Patient awake    Reviewed: Allergy & Precautions, H&P , NPO status , Patient's Chart, lab work & pertinent test results, reviewed documented beta blocker date and time   History of Anesthesia Complications (+) PONV and history of anesthetic complications  Airway Mallampati: II  TM Distance: >3 FB Neck ROM: full    Dental no notable dental hx.    Pulmonary neg pulmonary ROS   Pulmonary exam normal breath sounds clear to auscultation       Cardiovascular Exercise Tolerance: Good hypertension, negative cardio ROS  Rhythm:regular Rate:Normal     Neuro/Psych negative neurological ROS  negative psych ROS   GI/Hepatic Neg liver ROS,GERD  ,,  Endo/Other  negative endocrine ROS    Renal/GU Renal disease  negative genitourinary   Musculoskeletal   Abdominal   Peds  Hematology negative hematology ROS (+)   Anesthesia Other Findings   Reproductive/Obstetrics negative OB ROS                             Anesthesia Physical Anesthesia Plan  ASA: 2  Anesthesia Plan: General   Post-op Pain Management:    Induction:   PONV Risk Score and Plan: Propofol  infusion  Airway Management Planned:   Additional Equipment:   Intra-op Plan:   Post-operative Plan:   Informed Consent: I have reviewed the patients History and Physical, chart, labs and discussed the procedure including the risks, benefits and alternatives for the proposed anesthesia with the patient or authorized representative who has indicated his/her understanding and acceptance.     Dental Advisory Given  Plan Discussed with: CRNA  Anesthesia Plan Comments:        Anesthesia Quick Evaluation

## 2024-03-04 NOTE — Op Note (Addendum)
 [facility] Patient Name: Samantha [name] Procedure Date: 03/04/2024 8:50 AM MRN: [id number] Date of Birth: 10/03/1966 Attending MD: Rolando Cliche. Mordechai April , [place], [id number] CSN: [id number] Age: 58 Admit Type: Outpatient Procedure:                Colonoscopy Indications:              Follow-up of diverticulitis Providers:                Rolando Cliche. Mordechai April, DO, Troy Furnish. Hazeline Lister RN, RN,                            [place] Wilson, Technician, Russ Course,                            Pensions consultant Referring MD:              Medicines:                See the Anesthesia note for documentation of the                            administered medications Complications:            No immediate complications. Estimated Blood Loss:     Estimated blood loss was minimal. Procedure:                Pre-Anesthesia Assessment:                           - The anesthesia plan was to use monitored                            anesthesia care (MAC).                           After obtaining informed consent, the colonoscope                            was passed under direct vision. Throughout the                            procedure, the patient's blood pressure, pulse, and                            oxygen saturations were monitored continuously. The                            [id number]-HQ190L ([id number]) scope was introduced through                            the anus and advanced to the the terminal ileum,                            with identification of the appendiceal orifice and                            IC valve. The colonoscopy was performed without  difficulty. The patient tolerated the procedure                            well. The quality of the bowel preparation was                            evaluated using the BBPS [facility] Bowel Preparation                            Scale) with scores of: Right Colon = 3, Transverse                            Colon = 3 and Left Colon = 3  (entire mucosa seen                            well with no residual staining, small fragments of                            stool or opaque liquid). The total BBPS score                            equals 9. Scope In: 9:07:18 AM Scope Out: 9:30:14 AM Scope Withdrawal Time: 0 hours 16 minutes 11 seconds  Total Procedure Duration: 0 hours 22 minutes 56 seconds  Findings:      Hemorrhoids were found on perianal exam.      Multiple medium-mouthed and small-mouthed diverticula were found in the       sigmoid colon.      A 5 mm polyp was found in the descending colon. The polyp was sessile.       The polyp was removed with a cold snare. Resection and retrieval were       complete.      An 8 mm polyp was found in the sigmoid colon, ?inflammatory. The polyp       was sessile. The polyp was removed with a cold snare. Resection and       retrieval were complete. Area somewhat congested.      **Diverticulitis appears to have healed** Impression:               - Hemorrhoids found on perianal exam.                           - Diverticulosis in the sigmoid colon.                           - One 5 mm polyp in the descending colon, removed                            with a cold snare. Resected and retrieved.                           - One 8 mm polyp in the sigmoid colon, removed with  a cold snare. Resected and retrieved. Moderate Sedation:      Per Anesthesia Care Recommendation:           - Patient has a contact number available for                            emergencies. The signs and symptoms of potential                            delayed complications were discussed with the                            patient. Return to normal activities tomorrow.                            Written discharge instructions were provided to the                            patient.                           - Resume previous diet.                           - Continue present  medications.                           - Await pathology results.                           - Repeat colonoscopy date to be determined after                            pending pathology results are reviewed for                            surveillance based on pathology results.                           - Return to GI clinic in 8 weeks. Procedure Code(s):        --- Professional ---                           [phone], Colonoscopy, flexible; with removal of                            tumor(s), polyp(s), or other lesion(s) by snare                            technique Diagnosis Code(s):        --- Professional ---                           K64.9, Unspecified hemorrhoids                           D12.4, Benign neoplasm of descending colon  D12.5, Benign neoplasm of sigmoid colon                           K57.32, Diverticulitis of large intestine without                            perforation or abscess without bleeding                           K57.30, Diverticulosis of large intestine without                            perforation or abscess without bleeding CPT copyright 2022 American Medical Association. All rights reserved. The codes documented in this report are preliminary and upon coder review may  be revised to meet current compliance requirements. Rolando Cliche. Mordechai April, DO Rolando Cliche. Mordechai April, DO 03/04/2024 9:35:54 AM This report has been signed electronically. Number of Addenda: 0

## 2024-03-04 NOTE — Transfer of Care (Signed)
 Immediate Anesthesia Transfer of Care Note  Patient: Samantha [name]  Procedure(s) Performed: COLONOSCOPY  Patient Location: Endoscopy Unit  Anesthesia Type:General  Level of Consciousness: awake, alert , and oriented  Airway & Oxygen Therapy: Patient Spontanous Breathing  Post-op Assessment: Report given to RN, Post -op Vital signs reviewed and stable, Patient moving all extremities X 4, and Patient able to stick tongue midline  Post vital signs: Reviewed and stable  Last Vitals:  Vitals Value Taken Time  BP 100/60   Temp 96.8   Pulse 83   Resp 29   SpO2 92     Last Pain:  Vitals:   03/04/24 0904  TempSrc:   PainSc: 0-No pain      Patients Stated Pain Goal: 8 (03/04/24 0819)  Complications: No notable events documented.

## 2024-03-04 NOTE — Discharge Instructions (Addendum)
  Colonoscopy Discharge Instructions  Read the instructions outlined below and refer to this sheet in the next few weeks. These discharge instructions provide you with general information on caring for yourself after you leave the hospital. Your doctor may also give you specific instructions. While your treatment has been planned according to the most current medical practices available, unavoidable complications occasionally occur.   ACTIVITY You may resume your regular activity, but move at a slower pace for the next 24 hours.  Take frequent rest periods for the next 24 hours.  Walking will help get rid of the air and reduce the bloated feeling in your belly (abdomen).  No driving for 24 hours (because of the medicine (anesthesia) used during the test).   Do not sign any important legal documents or operate any machinery for 24 hours (because of the anesthesia used during the test).  NUTRITION Drink plenty of fluids.  You may resume your normal diet as instructed by your doctor.  Begin with a light meal and progress to your normal diet. Heavy or fried foods are harder to digest and may make you feel sick to your stomach (nauseated).  Avoid alcoholic beverages for 24 hours or as instructed.  MEDICATIONS You may resume your normal medications unless your doctor tells you otherwise.  WHAT YOU CAN EXPECT TODAY Some feelings of bloating in the abdomen.  Passage of more gas than usual.  Spotting of blood in your stool or on the toilet paper.  IF YOU HAD POLYPS REMOVED DURING THE COLONOSCOPY: No aspirin products for 7 days or as instructed.  No alcohol for 7 days or as instructed.  Eat a soft diet for the next 24 hours.  FINDING OUT THE RESULTS OF YOUR TEST Not all test results are available during your visit. If your test results are not back during the visit, make an appointment with your caregiver to find out the results. Do not assume everything is normal if you have not heard from your  caregiver or the medical facility. It is important for you to follow up on all of your test results.  SEEK IMMEDIATE MEDICAL ATTENTION IF: You have more than a spotting of blood in your stool.  Your belly is swollen (abdominal distention).  You are nauseated or vomiting.  You have a temperature over 101.  You have abdominal pain or discomfort that is severe or gets worse throughout the day.   Your colonoscopy revealed 2 polyp(s) which I removed successfully. Await pathology results, my office will contact you. I recommend repeating colonoscopy in 5 years for surveillance purposes.   You also have diverticulosis and external hemorrhoids. I would recommend increasing fiber in your diet or adding OTC Benefiber/Metamucil. Be sure to drink at least 4 to 6 glasses of water  daily.   Follow-up with GI in 8 weeks    I hope you have a great rest of your week!  Rolando Cliche. Mordechai April, D.O. Gastroenterology and Hepatology [facility]

## 2024-03-07 ENCOUNTER — Encounter (HOSPITAL_COMMUNITY): Payer: Self-pay | Admitting: Internal Medicine

## 2024-03-07 LAB — SURGICAL PATHOLOGY

## 2024-03-07 NOTE — Anesthesia Postprocedure Evaluation (Signed)
 Anesthesia Post Note  Patient: Samantha [name]  Procedure(s) Performed: COLONOSCOPY  Patient location during evaluation: Phase II Anesthesia Type: General Level of consciousness: awake Pain management: pain level controlled Vital Signs Assessment: post-procedure vital signs reviewed and stable Respiratory status: spontaneous breathing and respiratory function stable Cardiovascular status: blood pressure returned to baseline and stable Postop Assessment: no headache and no apparent nausea or vomiting Anesthetic complications: no Comments: Late entry   No notable events documented.   Last Vitals:  Vitals:   03/04/24 0831 03/04/24 0934  BP: 106/76 100/60  Pulse: (!) 58 76  Resp: 17 15  Temp: 36.6 C (!) 36 C  SpO2: 97% 93%    Last Pain:  Vitals:   03/04/24 0941  TempSrc:   PainSc: 0-No pain                 Coretha Dew

## 2024-03-17 ENCOUNTER — Ambulatory Visit: Payer: Self-pay | Admitting: Internal Medicine

## 2024-04-27 ENCOUNTER — Other Ambulatory Visit: Payer: Self-pay

## 2024-04-27 DIAGNOSIS — R7989 Other specified abnormal findings of blood chemistry: Secondary | ICD-10-CM

## 2024-05-02 ENCOUNTER — Ambulatory Visit (INDEPENDENT_AMBULATORY_CARE_PROVIDER_SITE_OTHER): Payer: PRIVATE HEALTH INSURANCE | Admitting: Gastroenterology

## 2024-05-02 ENCOUNTER — Encounter: Payer: Self-pay | Admitting: Gastroenterology

## 2024-05-02 VITALS — BP 104/70 | HR 80 | Temp 98.4°F | Ht 60.0 in | Wt 187.6 lb

## 2024-05-02 DIAGNOSIS — K529 Noninfective gastroenteritis and colitis, unspecified: Secondary | ICD-10-CM

## 2024-05-02 DIAGNOSIS — K760 Fatty (change of) liver, not elsewhere classified: Secondary | ICD-10-CM

## 2024-05-02 DIAGNOSIS — R7989 Other specified abnormal findings of blood chemistry: Secondary | ICD-10-CM | POA: Diagnosis not present

## 2024-05-02 MED ORDER — DICYCLOMINE HCL 20 MG PO TABS: ORAL_TABLET | ORAL | 5 refills | Status: AC

## 2024-05-02 NOTE — Patient Instructions (Addendum)
 Start dicyclomine  20mg  first thing every morning to try and reduce number of stools. You can take a dose at bedtime if needed as well.   Complete labs at your convenience. We will reach out with results as available.

## 2024-05-02 NOTE — Progress Notes (Signed)
 GI Office Note    Referring Provider: Marvine Rush, MD Primary Care Physician:  Marvine Rush, MD  Primary Gastroenterologist: ?  Chief Complaint   Chief Complaint  Patient presents with   Follow-up    Follow up after labs and procedure visit    History of Present Illness   Samantha [name] is a 58 y.o. female presenting today for follow up. Last seen 01/2024. H/o GERD, elevated LFTs, diverticulitis.   Since last ov, GI profil negative. Completed colonoscopy as outlined below. Continues with some intermittent loose stools. Pain before BM.Had diarrhea for 3 to four days, took pepto. Typically symptoms only in the mornings. Imodium too strong. Rarely uses Lomotil. No melena, brbpr. No heartburn, dysphagia, vomiting. Weight stable.   Prior Data: History of abnormal LFTS: mildly elevated transaminases. Work up thus far negative except for ferritin that has been historically elevated and elevated immunoglobulins as outlined. Liver U/S (11/2021)with hepatic steatosis. Dilated CBD to 11mm post cholecystectomy. MRCP (11/2021) prominent CBD without stones or apparent stricture/mass etc. Latest imaging by CT 12/2023 when she was being evaluated for abdominal pain, see below.   Labs in 06/2023: ALT mildly elevated at 43. IgG 1676H, IgA 411H, IgM 274H, ferritin 380, ANA/AMA/ASMA negative. Discussed with Dr. Shaaron, she may have MASH but need to discuss possibility of liver biopsy and/or fibrosis testing.    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 Alb 3.7. AST 38, ALT 53H, Tbili 0.3, AP 62. Platelets 300. Hgb 12.9. NAFLD F0-F2.    CT A/P with contrast 12/2023: IMPRESSION: 1. Findings most consistent with mild acute diverticulitis of the sigmoid colon. No perforation or abscess. 2. Large benign minimally complex cyst of the LEFT kidney not changed from comparison exams. Bosniak 2 renal cyst. No follow-up recommended. 3. Hepatic steatosis   MRI  Abd/MRCP 11/2021: IMPRESSION: 1. Prominence of the biliary tree with dilation of the common bile duct but gentle tapering of the distal duct as it extends through the head of the pancreas to the level of the ampulla, and no choledocholithiasis, stricture or biliary lesion/extrinsic compression identified. Findings consistent with reservoir effect post cholecystectomy. 2. Severe diffuse hepatic steatosis with geographic areas of decreased fatty infiltration including along the hepatic hilum, gallbladder fossa and falciform ligament. 3. Benign 11.7 cm Bosniak classification 2 left renal cyst.   EGD June 2022: - Small hiatal hernia. - Gastritis. Biopsied.  Chronic active gastritis, H. pylori present. - Normal duodenal bulb, first portion of the duodenum and second portion of the duodenum. Biopsied.  Peptic duodenitis.    Colonoscopy 02/2024: -hemorrhoids found on perianal exam -diverticulosis sigmoid colon -one 5mm polyp desc colon, hyperplastic -one 8mm polyp sigmoid colon, tubular adenoma -colonoscopy 7 years  Medications   Current Outpatient Medications  Medication Sig Dispense Refill   albuterol (VENTOLIN HFA) 108 (90 Base) MCG/ACT inhaler Inhale 2 puffs into the lungs every 4 (four) hours as needed.     diphenoxylate-atropine (LOMOTIL) 2.5-0.025 MG tablet Take 1 tablet by mouth 4 (four) times daily as needed.     No current facility-administered medications for this visit.    Allergies   Allergies as of 05/02/2024   (No Known Allergies)       Review of Systems   General: Negative for anorexia, weight loss, fever, chills, fatigue, weakness. ENT: Negative for hoarseness, difficulty swallowing , nasal congestion. CV: Negative for chest pain, angina, palpitations, dyspnea on exertion, peripheral edema.  Respiratory: Negative for dyspnea at  rest, dyspnea on exertion, cough, sputum, wheezing.  GI: See history of present illness. GU:  Negative for dysuria, hematuria,  urinary incontinence, urinary frequency, nocturnal urination.  Endo: Negative for unusual weight change.     Physical Exam   BP 104/70   Pulse 80   Temp 98.4 F (36.9 C)   Ht 5' (1.524 m)   Wt 187 lb 9.6 oz (85.1 kg)   LMP 09/12/2015 (Within Days) Comment: sparatic  BMI 36.64 kg/m    General: Well-nourished, well-developed in no acute distress.  Eyes: No icterus. Mouth: Oropharyngeal mucosa moist and pink   Lungs: Clear to auscultation bilaterally.  Heart: Regular rate and rhythm, no murmurs rubs or gallops.  Abdomen: Bowel sounds are normal, nontender, nondistended, no hepatosplenomegaly or masses,  no abdominal bruits or hernia , no rebound or guarding.  Rectal: not performed Extremities: No lower extremity edema. No clubbing or deformities. Neuro: Alert and oriented x 4   Skin: Warm and dry, no jaundice.   Psych: Alert and cooperative, normal mood and affect.  Labs   See hpi  Imaging Studies   No results found.  Assessment/Plan:   Chronic diarrhea: suspect IBS, extensive work up as outlined -trial of bentyl  20mg  BID prn.  Abnormal LFTs/hepatic steatosis: -mildly elevated ALT, IgG/IgA/IgM, ferritin *neg hemochromatosis genetic markers) -hepatic steatosis on imaging -dilated CBD on u/s, MRI/MRCP with steatosis but no stones/masses within biliary tress, suspected dilated CBD due to post cholecystectomy reservoir effect.  -Fibrosure, NAFLD favorable -LFTs, IgG, IgA, IgM, SPE, kappa/lambda light chains -Instructions for fatty liver: Recommend 1-2# weight loss per week until ideal body weight through exercise & diet. Low fat/cholesterol diet.   Avoid sweets, sodas, fruit juices, sweetened beverages like tea, etc. Gradually increase exercise from 15 min daily up to 1 hr per day 5 days/week. Limit alcohol use.    Sonny RAMAN. Ezzard, MHS, PA-C [facility]

## 2024-05-10 LAB — HEPATIC FUNCTION PANEL
ALT: 37 IU/L — ABNORMAL HIGH (ref 0–32)
AST: 21 IU/L (ref 0–40)
Albumin: 4.3 g/dL (ref 3.8–4.9)
Alkaline Phosphatase: 79 IU/L (ref 44–121)
Bilirubin Total: 0.3 mg/dL (ref 0.0–1.2)
Bilirubin, Direct: 0.11 mg/dL (ref 0.00–0.40)

## 2024-05-10 LAB — IGG, IGA, IGM
IgA/Immunoglobulin A, Serum: 406 mg/dL — ABNORMAL HIGH (ref 87–352)
IgG (Immunoglobin G), Serum: 1640 mg/dL — ABNORMAL HIGH (ref 586–1602)
IgM (Immunoglobulin M), Srm: 262 mg/dL — ABNORMAL HIGH (ref 26–217)

## 2024-05-10 LAB — PROTEIN ELECTROPHORESIS, SERUM, WITH REFLEX
A/G Ratio: 1 (ref 0.7–1.7)
Albumin ELP: 3.9 g/dL (ref 2.9–4.4)
Alpha 1: 0.3 g/dL (ref 0.0–0.4)
Alpha 2: 0.6 g/dL (ref 0.4–1.0)
Beta: 1.3 g/dL (ref 0.7–1.3)
Gamma Globulin: 1.7 g/dL (ref 0.4–1.8)
Globulin, Total: 3.8 g/dL (ref 2.2–3.9)
Total Protein: 7.7 g/dL (ref 6.0–8.5)

## 2024-05-10 LAB — KAPPA/LAMBDA LIGHT CHAINS
Ig Kappa Free Light Chain: 22.2 mg/L — AB (ref 3.3–19.4)
Ig Lambda Free Light Chain: 17.9 mg/L (ref 5.7–26.3)
KAPPA/LAMBDA RATIO: 1.24 (ref 0.26–1.65)

## 2024-05-21 ENCOUNTER — Ambulatory Visit: Payer: Self-pay | Admitting: Gastroenterology

## 2024-08-03 ENCOUNTER — Encounter: Payer: Self-pay | Admitting: Internal Medicine

## 2024-10-28 ENCOUNTER — Other Ambulatory Visit (HOSPITAL_COMMUNITY): Payer: Self-pay | Admitting: Family Medicine

## 2024-10-28 DIAGNOSIS — N281 Cyst of kidney, acquired: Secondary | ICD-10-CM

## 2024-11-02 ENCOUNTER — Ambulatory Visit (HOSPITAL_COMMUNITY)
Admission: RE | Admit: 2024-11-02 | Discharge: 2024-11-02 | Disposition: A | Discharge status: Home / Self Care | Payer: PRIVATE HEALTH INSURANCE | Source: Ambulatory Visit | Attending: Family Medicine | Admitting: Family Medicine

## 2024-11-02 DIAGNOSIS — N281 Cyst of kidney, acquired: Secondary | ICD-10-CM | POA: Diagnosis present
